# Patient Record
Sex: Female | Born: 1955 | Race: White | Hispanic: No | Marital: Married | Smoking: Current every day smoker
Health system: Southern US, Community
[De-identification: ages and names within clinical notes are randomized; demographics above are authoritative.]

## PROBLEM LIST (undated history)

## (undated) DIAGNOSIS — I10 Essential (primary) hypertension: Secondary | ICD-10-CM

## (undated) DIAGNOSIS — M0520 Rheumatoid vasculitis with rheumatoid arthritis of unspecified site: Secondary | ICD-10-CM

## (undated) DIAGNOSIS — IMO0002 Reserved for concepts with insufficient information to code with codable children: Secondary | ICD-10-CM

## (undated) DIAGNOSIS — R002 Palpitations: Secondary | ICD-10-CM

## (undated) DIAGNOSIS — G43109 Migraine with aura, not intractable, without status migrainosus: Secondary | ICD-10-CM

## (undated) DIAGNOSIS — M329 Systemic lupus erythematosus, unspecified: Secondary | ICD-10-CM

## (undated) DIAGNOSIS — E785 Hyperlipidemia, unspecified: Secondary | ICD-10-CM

## (undated) DIAGNOSIS — R55 Syncope and collapse: Secondary | ICD-10-CM

## (undated) HISTORY — DX: Syncope and collapse: R55

## (undated) HISTORY — PX: SPINE SURGERY: SHX786

## (undated) HISTORY — DX: Migraine with aura, not intractable, without status migrainosus: G43.109

## (undated) HISTORY — DX: Essential (primary) hypertension: I10

## (undated) HISTORY — DX: Hyperlipidemia, unspecified: E78.5

## (undated) HISTORY — DX: Palpitations: R00.2

## (undated) HISTORY — PX: LOOP RECORDER IMPLANT: SHX5954

## (undated) HISTORY — DX: Rheumatoid vasculitis with rheumatoid arthritis of unspecified site: M05.20

---

## 2013-08-08 DIAGNOSIS — F17200 Nicotine dependence, unspecified, uncomplicated: Secondary | ICD-10-CM | POA: Insufficient documentation

## 2013-08-08 DIAGNOSIS — G43109 Migraine with aura, not intractable, without status migrainosus: Secondary | ICD-10-CM | POA: Insufficient documentation

## 2013-08-08 DIAGNOSIS — I10 Essential (primary) hypertension: Secondary | ICD-10-CM | POA: Insufficient documentation

## 2013-08-08 DIAGNOSIS — E785 Hyperlipidemia, unspecified: Secondary | ICD-10-CM | POA: Insufficient documentation

## 2014-06-08 DIAGNOSIS — R002 Palpitations: Secondary | ICD-10-CM | POA: Insufficient documentation

## 2014-06-08 DIAGNOSIS — R55 Syncope and collapse: Secondary | ICD-10-CM | POA: Insufficient documentation

## 2014-06-08 DIAGNOSIS — Z8249 Family history of ischemic heart disease and other diseases of the circulatory system: Secondary | ICD-10-CM | POA: Insufficient documentation

## 2014-12-04 ENCOUNTER — Ambulatory Visit (INDEPENDENT_AMBULATORY_CARE_PROVIDER_SITE_OTHER): Payer: BLUE CROSS/BLUE SHIELD | Admitting: Internal Medicine

## 2014-12-04 ENCOUNTER — Encounter: Payer: Self-pay | Admitting: Internal Medicine

## 2014-12-04 VITALS — BP 180/88 | HR 52 | Ht 64.0 in | Wt 129.4 lb

## 2014-12-04 DIAGNOSIS — I10 Essential (primary) hypertension: Secondary | ICD-10-CM | POA: Diagnosis not present

## 2014-12-04 DIAGNOSIS — R011 Cardiac murmur, unspecified: Secondary | ICD-10-CM

## 2014-12-04 MED ORDER — AMLODIPINE BESYLATE 10 MG PO TABS: 10.0000 mg | ORAL_TABLET | Freq: Every day | ORAL | Status: DC

## 2014-12-04 NOTE — Progress Notes (Signed)
Cardiology Office Note   Date:  12/04/2014   ID:  Maria StanfordJanet Webster, DOB 09/30/1955, MRN [id number]  PCP:  Hassie BruceFUENTES,EDWIN, DO  Cardiologist:   Dietrich PatesPaula Lucciana Head, MD   No chief complaint on file.     History of Present Illness: Maria Webster is a 59 y.o. female with a history of palpitations, dizziness/presyncope   [facility] her report had extensive work up  Carotid USN showed mild narrowings   Underwent LINQ implant  Was seen in f/u in March  Amlodipine wasw increased at that visit.   LINQ interrogation to date showed no arrhythmia.  Had tansient episode syncope yesterday  Did not activitate LINQ or call Working long hours  Is tired    Talking to the patinet she is not taking amlodipine  Says her primary MD told her it was too much for her  Taking other meds  Denies CP  No palpitaitons.  Breathing OK    Current Outpatient Prescriptions  Medication Sig Dispense Refill  . amLODipine (NORVASC) 10 MG tablet Take 10 mg by mouth daily.    Marland Kitchen. aspirin 81 MG tablet Take 81 mg by mouth daily.    Marland Kitchen. atorvastatin (LIPITOR) 40 MG tablet Take 40 mg by mouth daily.    . Cholecalciferol (VITAMIN D3) 2000 UNITS capsule Take 2,000 Units by mouth daily.    . hydroxychloroquine (PLAQUENIL) 200 MG tablet Take by mouth daily.    Marland Kitchen. ibuprofen (ADVIL,MOTRIN) 800 MG tablet Take 800 mg by mouth every 8 (eight) hours as needed.    . metoprolol (LOPRESSOR) 100 MG tablet Take 100 mg by mouth 2 (two) times daily.    . vitamin C (ASCORBIC ACID) 500 MG tablet Take 500 mg by mouth daily.     No current facility-administered medications for this visit.    Allergies:   Codeine; Latex; and Novocain   Past Medical History  Diagnosis Date  . Syncope   . Palpitations   . Migraine with aura   . Hypertension   . Dyslipidemia     Past Surgical History  Procedure Laterality Date  . Loop recorder implant      Performed by Lynnell ChadPamela Mason, [facility] at [facility] LAB[facility]  . Spine surgery    .  Cesarean section      x2     Social History:  The patient  reports that she has been smoking.  She does not have any smokeless tobacco history on file. She reports that she does not drink alcohol or use illicit drugs.   Family History:  The patient's family history includes Heart attack in her sister and sister; Heart disease in her brother, brother, mother, and sister; Hypertension in her mother; Leukemia in her brother; Migraines in her mother and sister.    ROS:  Please see the history of present illness. All other systems are reviewed and  Negative to the above problem except as noted.    PHYSICAL EXAM: VS:  BP 180/88 mmHg  Pulse 52  Ht 5\' 4"  (1.626 m)  Wt 129 lb 6.4 oz (58.695 kg)  BMI 22.20 kg/m2  GEN: Well nourished, well developed, in no acute distress HEENT: normal Neck: no JVD, carotid bruits, or masses Cardiac: RRR; no murmurs, rubs, or gallops,no edema  Respiratory:  clear to auscultation bilaterally, normal work of breathing GI: soft, nontender, nondistended, + BS  No hepatomegaly  MS: no deformity Moving all extremities   Skin: warm and dry, no  rash Neuro:  Strength and sensation are intact Psych: euthymic mood, full affect   EKG:  EKG is ordered today.  Sinus bradycardia  52 bpm    Lipid Panel No results found for: CHOL, TRIG, HDL, CHOLHDL, VLDL, LDLCALC, LDLDIRECT    Wt Readings from Last 3 Encounters:  12/04/14 129 lb 6.4 oz (58.695 kg)      ASSESSMENT AND PLAN:  1.  HTN  BP is elevated today.  I would recomm that she take amlodipine  I would recomm that she continue to follow BP I would check an echo and renal usn. F/U in 4 wks    2.  Syncope  Had short spell driving  Seconds  I told her she cannot drive until cause is found or 6 mon without a syncopal spell  F/U in 1 month with me  Signed, Dietrich PatesPaula Amdrew Oboyle, MD  12/04/2014 12:33 PM    [facility] [address], [place], [place]  [address] Phone: [phone]; Fax: [phone])  [phone]

## 2014-12-04 NOTE — Patient Instructions (Signed)
Your physician has recommended you make the following change in your medication:  Restart your amlodipine 10 mg daily. Continue all other medications the same. Your physician has requested that you have an echocardiogram. Echocardiography is a painless test that uses sound waves to create images of your heart. It provides your doctor with information about the size and shape of your heart and how well your heart's chambers and valves are working. This procedure takes approximately one hour. There are no restrictions for this procedure. Your physician has requested that you have a renal artery duplex. During this test, an ultrasound is used to evaluate blood flow to the kidneys. Allow one hour for this exam. Do not eat after midnight the day before and avoid carbonated beverages. Take your medications as you usually do. Your physician recommends that you schedule a follow-up appointment in: [facility] Dr. Tenny Crawoss.

## 2015-01-01 ENCOUNTER — Ambulatory Visit (INDEPENDENT_AMBULATORY_CARE_PROVIDER_SITE_OTHER): Payer: BLUE CROSS/BLUE SHIELD | Admitting: Internal Medicine

## 2015-01-01 ENCOUNTER — Encounter: Payer: Self-pay | Admitting: Internal Medicine

## 2015-01-01 ENCOUNTER — Ambulatory Visit (HOSPITAL_BASED_OUTPATIENT_CLINIC_OR_DEPARTMENT_OTHER): Payer: BLUE CROSS/BLUE SHIELD

## 2015-01-01 ENCOUNTER — Other Ambulatory Visit: Payer: Self-pay

## 2015-01-01 ENCOUNTER — Ambulatory Visit (HOSPITAL_COMMUNITY): Payer: BLUE CROSS/BLUE SHIELD | Attending: Cardiovascular Disease

## 2015-01-01 VITALS — BP 126/78 | HR 49 | Ht 64.0 in | Wt 128.6 lb

## 2015-01-01 DIAGNOSIS — I340 Nonrheumatic mitral (valve) insufficiency: Secondary | ICD-10-CM | POA: Diagnosis not present

## 2015-01-01 DIAGNOSIS — I351 Nonrheumatic aortic (valve) insufficiency: Secondary | ICD-10-CM | POA: Diagnosis not present

## 2015-01-01 DIAGNOSIS — I350 Nonrheumatic aortic (valve) stenosis: Secondary | ICD-10-CM | POA: Diagnosis not present

## 2015-01-01 DIAGNOSIS — I10 Essential (primary) hypertension: Secondary | ICD-10-CM

## 2015-01-01 DIAGNOSIS — I700 Atherosclerosis of aorta: Secondary | ICD-10-CM | POA: Insufficient documentation

## 2015-01-01 DIAGNOSIS — R011 Cardiac murmur, unspecified: Secondary | ICD-10-CM | POA: Diagnosis not present

## 2015-01-01 DIAGNOSIS — G4733 Obstructive sleep apnea (adult) (pediatric): Secondary | ICD-10-CM | POA: Diagnosis not present

## 2015-01-01 DIAGNOSIS — I7090 Unspecified atherosclerosis: Secondary | ICD-10-CM | POA: Diagnosis not present

## 2015-01-01 NOTE — Progress Notes (Signed)
Cardiology Office Note   Date:  01/01/2015   ID:  Maria Webster, DOB 04-Aug-1955, MRN [id number]  PCP:  Hassie Bruce DO  Cardiologist:   Dietrich Pates, MD   No chief complaint on file.     History of Present Illness: Eizabeth Webster is a 59 y.o. female with a history of  I saw the pt in May 2016  Hx of palpitations, dizziness/ presyncope   Had linq put in at [facility].  BP was elevated  recomm amlodipne   Primary MD had taken off   With 4 wk f/u   Had short spell syncope while driving.       Current Outpatient Prescriptions  Medication Sig Dispense Refill  . amLODipine (NORVASC) 10 MG tablet Take 1 tablet (10 mg total) by mouth daily. 30 tablet 3  . aspirin 81 MG tablet Take 81 mg by mouth daily.    Marland Kitchen atorvastatin (LIPITOR) 40 MG tablet Take 40 mg by mouth daily.    . Cholecalciferol (VITAMIN D3) 2000 UNITS capsule Take 2,000 Units by mouth daily.    . hydroxychloroquine (PLAQUENIL) 200 MG tablet Take by mouth daily.    Marland Kitchen ibuprofen (ADVIL,MOTRIN) 800 MG tablet Take 800 mg by mouth every 8 (eight) hours as needed.    . metoprolol (LOPRESSOR) 100 MG tablet Take 100 mg by mouth 2 (two) times daily.    . vitamin C (ASCORBIC ACID) 500 MG tablet Take 500 mg by mouth daily.     No current facility-administered medications for this visit.    Allergies:   Codeine; Latex; and Novocain   Past Medical History  Diagnosis Date  . Syncope   . Palpitations   . Migraine with aura   . Hypertension   . Dyslipidemia     Past Surgical History  Procedure Laterality Date  . Loop recorder implant      Performed by Lynnell Chad, MD at [facility] CARDIAC CATH AND EP LABS  . Spine surgery    . Cesarean section      x2     Social History:  The patient  reports that she has been smoking Cigarettes.  She has been smoking about 0.50 packs per day. She does not have any smokeless tobacco history on file. She reports that she does not drink alcohol or use illicit drugs.   Family History:  The patient's  family history includes Heart attack in her sister and sister; Heart disease in her brother, brother, mother, and sister; Hypertension in her mother; Leukemia in her brother; Migraines in her mother and sister.    ROS:  Please see the history of present illness. All other systems are reviewed and  Negative to the above problem except as noted.    PHYSICAL EXAM: VS:  Pulse 49  Ht 5\' 4"  (1.626 m)  Wt 128 lb 9.6 oz (58.333 kg)  BMI 22.06 kg/m2  SpO2 92%  GEN: Well nourished, well developed, in no acute distress HEENT: normal Neck: no JVD, carotid bruits, or masses Cardiac: RRR; no murmurs, rubs, or gallops,no edema  Respiratory:  clear to auscultation bilaterally, normal work of breathing GI: soft, nontender, nondistended, + BS  No hepatomegaly  MS: no deformity Moving all extremities   Skin: warm and dry, no rash Neuro:  Strength and sensation are intact Psych: euthymic mood, full affect   EKG:  EKG is not ordered today.   Lipid Panel No results found for: CHOL, TRIG, HDL, CHOLHDL, VLDL, LDLCALC, LDLDIRECT    Wt Readings  from Last 3 Encounters:  01/01/15 128 lb 9.6 oz (58.333 kg)  12/04/14 129 lb 6.4 oz (58.695 kg)      ASSESSMENT AND PLAN:  1  Dizziness  / syncope Pt has LINQ placed  Need to get in cont[facility]ct with [facility] No change in regimen  No driving  2.  HTN  Continue meds      Signed, Dietrich Pates, MD  01/01/2015 2:16 PM    [facility] [address] [place], [place], [place]  [address] Phone: [phone]; Fax: [phone]

## 2015-01-01 NOTE — Patient Instructions (Signed)
Your physician recommends that you schedule a follow-up appointment in: to be determined  Your physician recommends that you continue on your current medications as directed. Please refer to the Current Medication list given to you today.  Dr. Tenny Craw will call you after speaking with neurologist/other physicians   Thanks for choosing [facility]!!!

## 2015-02-24 ENCOUNTER — Emergency Department (HOSPITAL_COMMUNITY)
Admission: EM | Admit: 2015-02-24 | Discharge: 2015-02-24 | Disposition: A | Discharge status: Home / Self Care | Payer: BLUE CROSS/BLUE SHIELD | Attending: Emergency Medicine | Admitting: Emergency Medicine

## 2015-02-24 ENCOUNTER — Telehealth: Payer: Self-pay | Admitting: Internal Medicine

## 2015-02-24 ENCOUNTER — Encounter (HOSPITAL_COMMUNITY): Payer: Self-pay

## 2015-02-24 DIAGNOSIS — G43109 Migraine with aura, not intractable, without status migrainosus: Secondary | ICD-10-CM | POA: Insufficient documentation

## 2015-02-24 DIAGNOSIS — Z79899 Other long term (current) drug therapy: Secondary | ICD-10-CM | POA: Insufficient documentation

## 2015-02-24 DIAGNOSIS — Z8739 Personal history of other diseases of the musculoskeletal system and connective tissue: Secondary | ICD-10-CM | POA: Insufficient documentation

## 2015-02-24 DIAGNOSIS — E785 Hyperlipidemia, unspecified: Secondary | ICD-10-CM | POA: Insufficient documentation

## 2015-02-24 DIAGNOSIS — Z7982 Long term (current) use of aspirin: Secondary | ICD-10-CM | POA: Insufficient documentation

## 2015-02-24 DIAGNOSIS — R55 Syncope and collapse: Secondary | ICD-10-CM | POA: Diagnosis not present

## 2015-02-24 DIAGNOSIS — I10 Essential (primary) hypertension: Secondary | ICD-10-CM | POA: Diagnosis not present

## 2015-02-24 DIAGNOSIS — Z720 Tobacco use: Secondary | ICD-10-CM | POA: Diagnosis not present

## 2015-02-24 HISTORY — DX: Reserved for concepts with insufficient information to code with codable children: IMO0002

## 2015-02-24 HISTORY — DX: Systemic lupus erythematosus, unspecified: M32.9

## 2015-02-24 LAB — BASIC METABOLIC PANEL
Anion gap: 9 (ref 5–15)
BUN: 12 mg/dL (ref 6–20)
CHLORIDE: 106 mmol/L (ref 101–111)
CO2: 26 mmol/L (ref 22–32)
CREATININE: 0.84 mg/dL (ref 0.44–1.00)
Calcium: 9.7 mg/dL (ref 8.9–10.3)
GFR calc Af Amer: >60 mL/min (ref >60)
GFR calc non Af Amer: >60 mL/min (ref >60)
GLUCOSE: 98 mg/dL (ref 65–99)
Potassium: 3.6 mmol/L (ref 3.5–5.1)
Sodium: 141 mmol/L (ref 135–145)

## 2015-02-24 LAB — CBC WITH DIFFERENTIAL/PLATELET
BASOS ABS: 0 10*3/uL (ref 0.0–0.1)
Basophils Relative: 1 % (ref 0–1)
Eosinophils Absolute: 0.2 10*3/uL (ref 0.0–0.7)
Eosinophils Relative: 3 % (ref 0–5)
HCT: 39.3 % (ref 36.0–46.0)
Hemoglobin: 13.6 g/dL (ref 12.0–15.0)
LYMPHS ABS: 2.2 10*3/uL (ref 0.7–4.0)
LYMPHS PCT: 29 % (ref 12–46)
MCH: 32.7 pg (ref 26.0–34.0)
MCHC: 34.6 g/dL (ref 30.0–36.0)
MCV: 94.5 fL (ref 78.0–100.0)
Monocytes Absolute: 0.4 10*3/uL (ref 0.1–1.0)
Monocytes Relative: 6 % (ref 3–12)
NEUTROS ABS: 4.7 10*3/uL (ref 1.7–7.7)
NEUTROS PCT: 61 % (ref 43–77)
PLATELETS: 161 10*3/uL (ref 150–400)
RBC: 4.16 MIL/uL (ref 3.87–5.11)
RDW: 13.1 % (ref 11.5–15.5)
WBC: 7.6 10*3/uL (ref 4.0–10.5)

## 2015-02-24 LAB — TROPONIN I

## 2015-02-24 NOTE — Telephone Encounter (Signed)
Pt c/o Syncope: STAT if syncope occurred within 30 minutes and pt complains of lightheadedness High Priority if episode of passing out, completely, today or in last 24 hours   1. Did you pass out today? YES   2. When is the last time you passed out? Today at 3pm   3. Has this occurred multiple times? Previously yes  4. Did you have any symptoms prior to passing out? Other than her heart beating fast. She Just felt foolish and like "Crap"   BP was 180/? She know it was high because she could feel her heart beating fairly fast. Pt reports that some how her BP is not coming down.

## 2015-02-24 NOTE — ED Provider Notes (Signed)
CSN: [id number]     Arrival date & time 02/24/15  1735 History   First MD Initiated Contact with Patient 02/24/15 1809     Chief Complaint  Patient presents with  . Loss of Consciousness      HPI Pt reports syncope with preceding "racing and pounding" palpitations. Pt with long standing hx of syncope and presyncope without a diagnosis to this point. No CP prior to syncope and asymptomatic at this time. Reports no n/v/d. 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 Attempts to keep herself hydrated at work. No fever or chills. No other complaints.    Past Medical History  Diagnosis Date  . Syncope   . Palpitations   . Migraine with aura   . Hypertension   . Dyslipidemia   . Lupus    Past Surgical History  Procedure Laterality Date  . Loop recorder implant      Performed by Lynnell Chad, MD at [facility] CARDIAC CATH AND EP LABS  . Spine surgery    . Cesarean section      x2   Family History  Problem Relation Age of Onset  . Hypertension Mother   . Heart disease Mother   . Migraines Mother   . Heart disease Sister   . Migraines Sister   . Heart attack Sister   . Heart disease Brother   . Leukemia Brother   . Heart attack Sister   . Heart disease Brother    Social History  Substance Use Topics  . Smoking status: Current Every Day Smoker -- 0.50 packs/day    Types: Cigarettes  . Smokeless tobacco: None  . Alcohol Use: No   OB History    No data available     Review of Systems  All other systems reviewed and are negative.     Allergies  Codeine; Latex; and Novocain  Home Medications   Prior to Admission medications   Medication Sig Start Date End Date Taking? Authorizing Provider  amLODipine (NORVASC) 10 MG tablet Take 1 tablet (10 mg total) by mouth daily. 12/04/14  Yes Pricilla Riffle, MD  aspirin EC 81 MG tablet Take 81 mg by mouth daily.   Yes Historical Provider, MD  atorvastatin (LIPITOR) 40 MG tablet Take 40 mg by mouth every evening.    Yes Historical Provider, MD   Cholecalciferol (VITAMIN D3) 2000 UNITS capsule Take 2,000 Units by mouth daily.   Yes Historical Provider, MD  hydroxychloroquine (PLAQUENIL) 200 MG tablet Take 200 mg by mouth daily.    Yes Historical Provider, MD  metoprolol (LOPRESSOR) 100 MG tablet Take 100 mg by mouth 2 (two) times daily.   Yes Historical Provider, MD  vitamin C (ASCORBIC ACID) 500 MG tablet Take 500 mg by mouth daily.   Yes Historical Provider, MD   BP 164/85 mmHg  Pulse 75  Temp(Src) 98.6 F (37 C) (Oral)  Resp 16  Ht  (1.626 m)  Wt 129 lb (58.514 kg)  BMI 22.13 kg/m2  SpO2 99% Physical Exam  Constitutional: She is oriented to person, place, and time. She appears well-developed and well-nourished. No distress.  HENT:  Head: Normocephalic and atraumatic.  Eyes: EOM are normal.  Neck: Normal range of motion.  Cardiovascular: Normal rate, regular rhythm and normal heart sounds.   Pulmonary/Chest: Effort normal and breath sounds normal.  Abdominal: Soft. She exhibits no distension. There is no tenderness.  Musculoskeletal: Normal range of motion.  Neurological: She is alert and oriented to person, place, and time.  Skin: Skin is warm and dry.  Psychiatric: She has a normal mood and affect. Judgment normal.  Nursing note and vitals reviewed.   ED Course  Procedures (including critical care time) Labs Review Labs Reviewed  CBC WITH DIFFERENTIAL/PLATELET  BASIC METABOLIC PANEL  TROPONIN I     Imaging Review No results found.  ECG interpretation   Date: 02/24/2015  Rate: 73  Rhythm: normal sinus rhythm  QRS Axis: normal  Intervals: normal  ST/T Wave abnormalities: normal  Conduction Disutrbances: none  Narrative Interpretation:   Old EKG Reviewed: No significant changes noted     I reviewed available ER/hospitalization records through the EMR    MDM   Final diagnoses:  None    medtronic implantable loop recorder interrogated. Possible PAC or PVC noted. No tachyarrythmias  noted. Labs normal. Long standing hx of the same. pcp follow up    Azalia Bilis, MD 02/24/15 [phone]

## 2015-02-24 NOTE — ED Notes (Signed)
Pt reports has a recordable loop heart monitor and reports passed out at work today.   Pt says she called her cardiologist approx 1 1/2 hours after she passed out.  Pt c/o feeling tired after the incident.  Pt says felt "fine" early this morning.  Pt says for years has been having syncopal episodes.  Pt says prior to passing out today, felt like heart was "pounding."  Pt denies any pain or SOB.

## 2015-02-24 NOTE — Telephone Encounter (Signed)
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  Pt stated she was down for about 2 minutes.  Pt stated that office first responders were there provided here with cold wet cloth on back of her neck and sat her in a chair. Pt stated that she felt funny and now "feels like someone he has been beat up."  On phone pt checked on BP 175/94 HR 79. Pt stated this has a LINQ.  Tried to pull pt LINQ report to see if we could see any transmission but our office does not have access.  Called pt back told to go to the ED via EMS as she needs to have blood work drawn and be evaluated. Pt stated she would not drive but will get her husband to drive, that is fine as long as she is not driving. Pt stated she is going to [employer]

## 2015-02-24 NOTE — Discharge Instructions (Signed)
Syncope °Syncope is a medical term for fainting or passing out. This means you lose consciousness and drop to the ground. People are generally unconscious for less than 5 minutes. You may have some muscle twitches for up to 15 seconds before waking up and returning to normal. Syncope occurs more often in older adults, but it can happen to anyone. While most causes of syncope are not dangerous, syncope can be a sign of a serious medical problem. It is important to seek medical care.  °CAUSES  °Syncope is caused by a sudden drop in blood flow to the brain. The specific cause is often not determined. Factors that can bring on syncope include: °· Taking medicines that lower blood pressure. °· Sudden changes in posture, such as standing up quickly. °· Taking more medicine than prescribed. °· Standing in one place for too long. °· Seizure disorders. °· Dehydration and excessive exposure to heat. °· Low blood sugar (hypoglycemia). °· Straining to have a bowel movement. °· Heart disease, irregular heartbeat, or other circulatory problems. °· Fear, emotional distress, seeing blood, or severe pain. °SYMPTOMS  °Right before fainting, you may: °· Feel dizzy or light-headed. °· Feel nauseous. °· See all white or all black in your field of vision. °· Have cold, clammy skin. °DIAGNOSIS  °Your health care provider will ask about your symptoms, perform a physical exam, and perform an electrocardiogram (ECG) to record the electrical activity of your heart. Your health care provider may also perform other heart or blood tests to determine the cause of your syncope which may include: °· Transthoracic echocardiogram (TTE). During echocardiography, sound waves are used to evaluate how blood flows through your heart. °· Transesophageal echocardiogram (TEE). °· Cardiac monitoring. This allows your health care provider to monitor your heart rate and rhythm in real time. °· Holter monitor. This is a portable device that records your  heartbeat and can help diagnose heart arrhythmias. It allows your health care provider to track your heart activity for several days, if needed. °· Stress tests by exercise or by giving medicine that makes the heart beat faster. °TREATMENT  °In most cases, no treatment is needed. Depending on the cause of your syncope, your health care provider may recommend changing or stopping some of your medicines. °HOME CARE INSTRUCTIONS °· Have someone stay with you until you feel stable. °· Do not drive, use machinery, or play sports until your health care provider says it is okay. °· Keep all follow-up appointments as directed by your health care provider. °· Lie down right away if you start feeling like you might faint. Breathe deeply and steadily. Wait until all the symptoms have passed. °· Drink enough fluids to keep your urine clear or pale yellow. °· If you are taking blood pressure or heart medicine, get up slowly and take several minutes to sit and then stand. This can reduce dizziness. °SEEK IMMEDIATE MEDICAL CARE IF:  °· You have a severe headache. °· You have unusual pain in the chest, abdomen, or back. °· You are bleeding from your mouth or rectum, or you have black or tarry stool. °· You have an irregular or very fast heartbeat. °· You have pain with breathing. °· You have repeated fainting or seizure-like jerking during an episode. °· You faint when sitting or lying down. °· You have confusion. °· You have trouble walking. °· You have severe weakness. °· You have vision problems. °If you fainted, call your local emergency services (911 in [place] Do not drive   yourself to the hospital.  °MAKE SURE YOU: °· Understand these instructions. °· Will watch your condition. °· Will get help right away if you are not doing well or get worse. °Document Released: 07/03/2005 Document Revised: 07/08/2013 Document Reviewed: 09/01/2011 °ExitCare® Patient Information ©2015 [employer]. This information is not intended to replace  advice given to you by your health care provider. Make sure you discuss any questions you have with your health care provider. ° °

## 2015-02-25 ENCOUNTER — Other Ambulatory Visit: Payer: Self-pay | Admitting: Internal Medicine

## 2015-02-25 MED ORDER — METOPROLOL TARTRATE 100 MG PO TABS: 100.0000 mg | ORAL_TABLET | Freq: Two times a day (BID) | ORAL | Status: DC

## 2015-02-25 NOTE — Telephone Encounter (Signed)
Pt is currently out of her Metoprolol  and she uses [facility]

## 2015-03-23 ENCOUNTER — Encounter: Payer: Self-pay | Admitting: Internal Medicine

## 2015-04-19 ENCOUNTER — Ambulatory Visit: Payer: BLUE CROSS/BLUE SHIELD | Admitting: Internal Medicine

## 2015-05-17 ENCOUNTER — Ambulatory Visit: Payer: BLUE CROSS/BLUE SHIELD | Admitting: Internal Medicine

## 2015-06-07 ENCOUNTER — Ambulatory Visit (INDEPENDENT_AMBULATORY_CARE_PROVIDER_SITE_OTHER): Payer: BLUE CROSS/BLUE SHIELD | Admitting: Internal Medicine

## 2015-06-07 ENCOUNTER — Encounter: Payer: Self-pay | Admitting: Internal Medicine

## 2015-06-07 VITALS — BP 158/70 | HR 56 | Ht 64.0 in | Wt 127.0 lb

## 2015-06-07 DIAGNOSIS — R55 Syncope and collapse: Secondary | ICD-10-CM

## 2015-06-07 MED ORDER — METOPROLOL TARTRATE 50 MG PO TABS: 50.0000 mg | ORAL_TABLET | Freq: Two times a day (BID) | ORAL | Status: DC

## 2015-06-07 MED ORDER — TRIAMTERENE-HCTZ 37.5-25 MG PO TABS: 0.5000 | ORAL_TABLET | Freq: Every day | ORAL | Status: DC

## 2015-06-07 MED ORDER — AMLODIPINE BESYLATE 10 MG PO TABS: 10.0000 mg | ORAL_TABLET | Freq: Every day | ORAL | Status: DC

## 2015-06-07 NOTE — Progress Notes (Signed)
Cardiology Office Note   Date:  06/07/2015   ID:  Maria Webster, DOB [birth year], MRN [id number]  PCP:  Hassie Bruce DO  Cardiologist:   Dietrich Pates, MD    F/U of syncope     History of Present Illness: Maria Webster is a 59 y.o. female with a history of palpitations , dizziness/presyncope and syncope  She has been seen at [facility]  I saw her in June  Records from [facility] sent  No arrhythmia seen after episode of syncope earlier this year    In August she had episode of syncope at work  Prior to passing out felt heart pounding  Loop interrogated  Possible PAC ofr PVC    Patient said in August at work  At 2 PM got light hea  Feels light headed a lot  Has to hold on to things  Feels like drunk  Room doesn't spin [place] manuevers neg  Will go numb in face  She is tired of feeling bad   No CP       Current Outpatient Prescriptions  Medication Sig Dispense Refill  . amLODipine (NORVASC) 10 MG tablet Take 1 tablet (10 mg total) by mouth daily. 30 tablet 3  . aspirin EC 81 MG tablet Take 81 mg by mouth daily.    Marland Kitchen atorvastatin (LIPITOR) 40 MG tablet Take 40 mg by mouth every evening.     . Cholecalciferol (VITAMIN D3) 2000 UNITS capsule Take 2,000 Units by mouth daily.    . hydroxychloroquine (PLAQUENIL) 200 MG tablet Take 200 mg by mouth daily.     . metoprolol (LOPRESSOR) 100 MG tablet Take 1 tablet (100 mg total) by mouth 2 (two) times daily. 180 tablet 1  . vitamin C (ASCORBIC ACID) 500 MG tablet Take 500 mg by mouth daily.     No current facility-administered medications for this visit.    Allergies:   Codeine; Latex; and Novocain   Past Medical History  Diagnosis Date  . Syncope   . Palpitations   . Migraine with aura   . Hypertension   . Dyslipidemia   . Lupus [facility])     Past Surgical History  Procedure Laterality Date  . Loop recorder implant      Performed by Lynnell Chad, MD at [facility] CARDIAC CATH AND EP LABS  . Spine surgery    . Cesarean section      x2      Social History:  The patient  reports that she has been smoking Cigarettes.  She has been smoking about 0.50 packs per day. She does not have any smokeless tobacco history on file. She reports that she does not drink alcohol or use illicit drugs.   Family History:  The patient's family history includes Heart attack in her sister and sister; Heart disease in her brother, brother, mother, and sister; Hypertension in her mother; Leukemia in her brother; Migraines in her mother and sister.    ROS:  Please see the history of present illness. All other systems are reviewed and  Negative to the above problem except as noted.    PHYSICAL EXAM: VS:  BP 158/70 mmHg  Pulse 56  Ht  (1.626 m)  Wt 57.607 kg (127 lb)  BMI 21.79 kg/m2  SpO2 95%  GEN: Well nourished, well developed, in no acute distress HEENT: normal Neck: no JVD, carotid bruits, or masses Cardiac: RRR; no murmurs, rubs, or gallops,no edema  Respiratory:  clear to auscultation bilaterally, normal work of breathing  GI: soft, nontender, nondistended, + BS  No hepatomegaly  MS: no deformity Moving all extremities   Skin: warm and dry, no rash Neuro:  Strength and sensation are intact Psych: euthymic mood, full affect   EKG:  EKG is NOT ordered today.   Lipid Panel No results found for: CHOL, TRIG, HDL, CHOLHDL, VLDL, LDLCALC, LDLDIRECT    Wt Readings from Last 3 Encounters:  06/07/15 57.607 kg (127 lb)  02/24/15 58.514 kg (129 lb)  01/01/15 58.333 kg (128 lb 9.6 oz)      ASSESSMENT AND PLAN:  1  Syncope  Last spell in August  [facility] to [facility] and weak, dizzy at times Encouraged increased fluids   Not orthostatic  Acutally hypertensive with standing  HR is low I would pull back on metoprolol to 50 bid and add low dose maxzide Discuss with Medtr[employer]  Pt says that she is getting calls frequently asking how she is feeling  Says they tell her she has skips Discuss with  EP  2.  HTN  As above  Follow BP     Signed, Dietrich PatesPaula Belva Koziel, MD  06/07/2015 9:25 AM    [facility] [address], [place], [place]  [address] Phone: [phone]([phone]; Fax: [phone]

## 2015-06-07 NOTE — Patient Instructions (Signed)
Your physician recommends that you schedule a follow-up appointment in: To Be Determined   Your physician has recommended you make the following change in your medication:   Decrease Metoprolol ( Lopressor) to 50 mg Two Times Daily  Start Maxzide 37.5-25 mg Take 1/2 Tablet Daily   If you need a refill on your cardiac medications before your next appointment, please call your pharmacy.  Thank you for choosing [facility]!

## 2015-07-23 ENCOUNTER — Encounter: Payer: Self-pay | Admitting: Internal Medicine

## 2015-07-23 ENCOUNTER — Ambulatory Visit (INDEPENDENT_AMBULATORY_CARE_PROVIDER_SITE_OTHER): Payer: BLUE CROSS/BLUE SHIELD | Admitting: Internal Medicine

## 2015-07-23 VITALS — BP 152/72 | HR 41 | Ht 64.0 in | Wt 128.0 lb

## 2015-07-23 DIAGNOSIS — R55 Syncope and collapse: Secondary | ICD-10-CM | POA: Diagnosis not present

## 2015-07-23 DIAGNOSIS — I10 Essential (primary) hypertension: Secondary | ICD-10-CM

## 2015-07-23 MED ORDER — METOPROLOL TARTRATE 25 MG PO TABS: 25.0000 mg | ORAL_TABLET | Freq: Two times a day (BID) | ORAL | Status: DC

## 2015-07-23 MED ORDER — LOSARTAN POTASSIUM 50 MG PO TABS: 50.0000 mg | ORAL_TABLET | Freq: Every day | ORAL | Status: DC

## 2015-07-23 NOTE — Progress Notes (Signed)
Cardiology Office Note   Date:  07/23/2015   ID:  Maria StanfordJanet Aung, DOB 04/15/1956, MRN [id number]  PCP:  Hassie BruceFUENTES,EDWIN, DO  Cardiologist:   Dietrich PatesPaula Ross, MD   F/U of dizziness/syncope    History of Present Illness: Maria Webster is a 60 y.o. female with a history of palpitations , dizziness and syncope  I saw the pt earlier this fall.  [facility] LINQ in place.  Last interrogation no signif arrhythmia  Since seen she has had no syncopal spells  Has been dizziy, drunk like at times    On last visit I recomm that she decrease metoprolol to 50 bid  Add low dose maxzide  HR was low   She took 1 dose of Maxzide and says her HR went to 160  Stopped  Has not taken any more   She still feels fatigueded        Current Outpatient Prescriptions  Medication Sig Dispense Refill  . amLODipine (NORVASC) 10 MG tablet Take 1 tablet (10 mg total) by mouth daily. 90 tablet 3  . aspirin EC 81 MG tablet Take 81 mg by mouth daily.    Marland Kitchen. atorvastatin (LIPITOR) 40 MG tablet Take 40 mg by mouth every evening.     . Cholecalciferol (VITAMIN D3) 2000 UNITS capsule Take 2,000 Units by mouth daily.    . hydroxychloroquine (PLAQUENIL) 200 MG tablet Take 200 mg by mouth daily.     . metoprolol (LOPRESSOR) 50 MG tablet Take 1 tablet (50 mg total) by mouth 2 (two) times daily. 180 tablet 3  . triamterene-hydrochlorothiazide (MAXZIDE-25) 37.5-25 MG tablet Take 0.5 tablets by mouth daily. 15 tablet 3  . vitamin C (ASCORBIC ACID) 500 MG tablet Take 500 mg by mouth daily.     No current facility-administered medications for this visit.    Allergies:   Codeine; Latex; and Novocain   Past Medical History  Diagnosis Date  . Syncope   . Palpitations   . Migraine with aura   . Hypertension   . Dyslipidemia   . Lupus [facility](HCC)     Past Surgical History  Procedure Laterality Date  . Loop recorder implant      Per[facility] by Lynnell [facility]Pamela Mason[facility]  . Spine surgery      . Cesarean section      x2     Social History:  The patient  reports that she has been smoking Cigarettes.  She started smoking about 53 years ago. She has been smoking about 0.50 packs per day. She has never used smokeless tobacco. She reports that she does not drink alcohol or use illicit drugs.   Family History:  The patient's family history includes Heart attack in her sister and sister; Heart disease in her brother, brother, mother, and sister; Hypertension in her mother; Leukemia in her brother; Migraines in her mother and sister.    ROS:  Please see the history of present illness. All other systems are reviewed and  Negative to the above problem except as noted.    PHYSICAL EXAM: VS:  BP 152/72 mmHg  Pulse 41  Ht 5\' 4"  (1.626 m)  Wt 58.06 kg (128 lb)  BMI 21.96 kg/m2  SpO2 99%  GEN: Well nourished, well developed, in no acute distress HEENT: normal Neck: no JVD, carotid bruits, or masses Cardiac: RRR; no murmurs, rubs, or gallops,no edema  Respiratory:  clear to auscultation bilaterally, normal work of breathing GI:  soft, nontender, nondistended, + BS  No hepatomegaly  MS: no deformity Moving all extremities   Skin: warm and dry, no rash Neuro:  Strength and sensation are intact Psych: euthymic mood, full affect   EKG:  EKG is not ordered today.   Lipid Panel No results found for: CHOL, TRIG, HDL, CHOLHDL, VLDL, LDLCALC, LDLDIRECT    Wt Readings from Last 3 Encounters:  07/23/15 58.06 kg (128 lb)  06/07/15 57.607 kg (127 lb)  02/24/15 58.514 kg (129 lb)      ASSESSMENT AND PLAN:  1.  Dizziness/syncope  Pt has not had any more syncopal spells since seen  She still feels fatigued  I would recomm cutting back on metoprolol to 25 bid    Will contact EP  See if she can have remote checks in [place] and not [place]  2.  HTN  Will add Cozaar 50 qd    F/U in clinic in a couple months  Check BMET  In 10 days  At that time check lipid panel as well.      Signed, Dietrich Pates, MD  07/23/2015 1:12 PM    [facility] [address] [place], [place], [place]  [address] Phone: [phone]; Fax: [phone]

## 2015-07-23 NOTE — Patient Instructions (Signed)
Your physician recommends that you schedule a follow-up appointment in: 2 Months with Dr. Tenny Crawoss  Your physician recommends that you return for lab work in: 10 days to 2 weeks (BMet, Lipid)   Your physician has recommended you make the following change in your medication:   Start Cozaar 50 mg Take 1 Tablet by mouth Daily  Decrease Metoprolol to 25 mg Two Times Daily   If you need a refill on your cardiac medications before your next appointment, please call your pharmacy.  Thank you for choosing [facility]!

## 2015-08-11 ENCOUNTER — Encounter: Payer: Self-pay | Admitting: Internal Medicine

## 2015-08-11 ENCOUNTER — Telehealth: Payer: Self-pay | Admitting: *Deleted

## 2015-08-11 NOTE — Telephone Encounter (Signed)
Spoke with patient regarding symptom episodes on LINQ transmission.  Patient reports that early this morning (around 2-3am) she woke up and felt dizzy.  She used her symptom activator because she felt that her heart rate and B/P were elevated.  She states she drove down the street to a doctor's office later this morning and the office called 911 as her B/P was 240/117.  She was taken to [facility] and was given a medication in the ED that brought her B/P down to "109/something".  Patient is unsure of which medication it was.  She was discharged from the ED later in the morning and was told to schedule a follow-up appointment with Dr. Tenny Craw.  Patient states she was not given any prescriptions at discharge.  She states she has been taking her losartan  and metoprolol  as prescribed, but was only taking amlodipine  instead of .  Advised patient to continue taking all medications as prescribed and to ensure she is taking amlodipine .  Patient verbalizes understanding of instructions and denies additional questions at this time.  Will route to Dr. Tenny Craw for additional recommendations.

## 2015-08-12 NOTE — Telephone Encounter (Signed)
Pt states she will call back next week BP recordings

## 2015-08-12 NOTE — Telephone Encounter (Signed)
[id number] 

## 2015-08-12 NOTE — Telephone Encounter (Signed)
Have her continue to take BPs at home and call in with readings early next week

## 2015-08-25 ENCOUNTER — Telehealth: Payer: Self-pay | Admitting: Internal Medicine

## 2015-08-25 DIAGNOSIS — I10 Essential (primary) hypertension: Secondary | ICD-10-CM

## 2015-08-25 NOTE — Telephone Encounter (Signed)
Patient states that she was told to call back to report BP readings.  Knute Neu

## 2015-08-25 NOTE — Telephone Encounter (Signed)
Patient states that she takes all her med at 0330 every morning and takes Metoprolol again at 1800.        08/12/15     08/14/15        08/15/15    08/16/15     08/17/15   Morning   134/84       164/102       165/94     152/97      178/97   Noon        134/87      170/94          154/87     142/85       150/86   Night         162/83      143/84                          132/88        107/66

## 2015-08-26 MED ORDER — LOSARTAN POTASSIUM 100 MG PO TABS: 100.0000 mg | ORAL_TABLET | Freq: Every day | ORAL | Status: DC

## 2015-08-26 NOTE — Telephone Encounter (Signed)
Increae cozaar to 100 mg per day   F/U with BP check in 1 month  Bring cuff to clinic

## 2015-08-26 NOTE — Telephone Encounter (Signed)
Pt notified escribed rx bp apt made

## 2015-08-30 ENCOUNTER — Ambulatory Visit (INDEPENDENT_AMBULATORY_CARE_PROVIDER_SITE_OTHER): Payer: BLUE CROSS/BLUE SHIELD | Admitting: *Deleted

## 2015-08-30 DIAGNOSIS — R002 Palpitations: Secondary | ICD-10-CM

## 2015-08-31 NOTE — Progress Notes (Signed)
Carelink Summary Report / Loop Recorder 

## 2015-09-23 ENCOUNTER — Ambulatory Visit (INDEPENDENT_AMBULATORY_CARE_PROVIDER_SITE_OTHER): Payer: BLUE CROSS/BLUE SHIELD

## 2015-09-23 ENCOUNTER — Other Ambulatory Visit: Payer: Self-pay

## 2015-09-23 VITALS — BP 125/84 | HR 77

## 2015-09-23 DIAGNOSIS — E785 Hyperlipidemia, unspecified: Secondary | ICD-10-CM

## 2015-09-23 DIAGNOSIS — I10 Essential (primary) hypertension: Secondary | ICD-10-CM | POA: Diagnosis not present

## 2015-09-23 LAB — LIPID PANEL
Cholesterol: 248 mg/dL — ABNORMAL HIGH (ref 125–200)
HDL: 37 mg/dL — ABNORMAL LOW (ref >=46)
LDL CALC: 150 mg/dL — AB (ref <130)
Total CHOL/HDL Ratio: 6.7 Ratio — ABNORMAL HIGH (ref <=5.0)
Triglycerides: 306 mg/dL — ABNORMAL HIGH (ref <150)
VLDL: 61 mg/dL — ABNORMAL HIGH (ref <30)

## 2015-09-23 NOTE — Patient Instructions (Signed)
Continue medications as prescribed unless Dr Tenny Crawoss changes them      Thank you for choosing [facility] !

## 2015-09-23 NOTE — Progress Notes (Signed)
Patient brought in BP cuff,equal to ours  Patient wants to see neurologist because she had vision chanes and numbness [facility]   Will forward to Dr Tenny Crawoss

## 2015-09-24 LAB — BASIC METABOLIC PANEL
BUN: 9 mg/dL (ref 7–25)
CALCIUM: 9.2 mg/dL (ref 8.6–10.4)
CO2: 26 mmol/L (ref 20–31)
CREATININE: 0.72 mg/dL (ref 0.50–1.05)
Chloride: 106 mmol/L (ref 98–110)
GLUCOSE: 85 mg/dL (ref 65–99)
Potassium: 3.9 mmol/L (ref 3.5–5.3)
Sodium: 140 mmol/L (ref 135–146)

## 2015-09-29 ENCOUNTER — Ambulatory Visit (INDEPENDENT_AMBULATORY_CARE_PROVIDER_SITE_OTHER): Payer: BLUE CROSS/BLUE SHIELD | Admitting: *Deleted

## 2015-09-29 DIAGNOSIS — R002 Palpitations: Secondary | ICD-10-CM

## 2015-09-30 NOTE — Progress Notes (Signed)
Carelink Summary Report / Loop Recorder 

## 2015-10-05 ENCOUNTER — Telehealth: Payer: Self-pay | Admitting: *Deleted

## 2015-10-05 DIAGNOSIS — R2689 Other abnormalities of gait and mobility: Secondary | ICD-10-CM

## 2015-10-05 NOTE — Telephone Encounter (Signed)
-----   Message from Pricilla RifflePaula Ross V, MD sent at 09/24/2015  9:17 AM EST ----- Lipid panel is not good LDL is high at 150  Want 100 or less Triglycerids are high at 306  Goal 150  REcomm Decrease saturated fat intake and carbohydrates WOuld recomm a referral to dietary to review diet  Instruct on changes she can make Would repeat lipids in 9 months

## 2015-10-05 NOTE — Telephone Encounter (Signed)
Please refer to dietary Also set referral with [facility]

## 2015-10-05 NOTE — Telephone Encounter (Signed)
Called patient to inform of lab results/recommendations. She is not interested in a dietary referral at this time and will make suggested changes. She continues lipitor 40 mg daily.  She has 2 concerns:  Will Dr. Tenny Crawoss consider a referral to neurology because she feels that about a week after her hospitalization she was at work and her face became numb and she almost collapsed and since then she has poor balance and her speech "just wont come out right" and she saw an eye dr and was told that she lost some sight that she will never get back.  She is not sure if one/both eyes but it is not glaucoma.  Also--she is not sending transmissions monthly or periodicly for her loop recorder.  She has the equipment at home but is not sure where it will go.   I adv I think the device clinic is getting remote transmissions but told her I will ask the device nurse to contact her for her concerns.

## 2015-10-06 NOTE — Telephone Encounter (Signed)
Referral to neurology completed. Patient states yesterday that she does not want to see dietary at this time.  No dietary referral placed at this time.

## 2015-10-06 NOTE — Telephone Encounter (Signed)
LMOM regarding automatic LINQ transmissions- they are being received. If we need her to send any transmissions manually we will contact her to do so. Advised to call back to office if she has further questions.

## 2015-10-29 ENCOUNTER — Ambulatory Visit (INDEPENDENT_AMBULATORY_CARE_PROVIDER_SITE_OTHER): Payer: BLUE CROSS/BLUE SHIELD | Admitting: *Deleted

## 2015-10-29 DIAGNOSIS — R002 Palpitations: Secondary | ICD-10-CM | POA: Diagnosis not present

## 2015-11-01 NOTE — Progress Notes (Signed)
Carelink Summary Report / Loop Recorder 

## 2015-11-02 ENCOUNTER — Encounter: Payer: Self-pay | Admitting: Neurology

## 2015-11-02 ENCOUNTER — Ambulatory Visit (INDEPENDENT_AMBULATORY_CARE_PROVIDER_SITE_OTHER): Payer: BLUE CROSS/BLUE SHIELD | Admitting: Neurology

## 2015-11-02 VITALS — BP 130/72 | HR 83 | Ht 64.0 in | Wt 127.0 lb

## 2015-11-02 DIAGNOSIS — R479 Unspecified speech disturbances: Secondary | ICD-10-CM | POA: Diagnosis not present

## 2015-11-02 DIAGNOSIS — R404 Transient alteration of awareness: Secondary | ICD-10-CM | POA: Diagnosis not present

## 2015-11-02 DIAGNOSIS — G8190 Hemiplegia, unspecified affecting unspecified side: Secondary | ICD-10-CM | POA: Diagnosis not present

## 2015-11-02 DIAGNOSIS — R55 Syncope and collapse: Secondary | ICD-10-CM

## 2015-11-02 NOTE — Patient Instructions (Addendum)
Your episodes may be either migraine or seizure.   First, I want to perform some tests to find out what it is. 1.  CT and CTA of head 2.  Carotid doppler 3.  EEG 4.  Since you have unexplained recurrent episodes of passing out, I think you shouldn't be driving until we can figure out what is going on  Follow up after testing and we will discuss how to treat this.

## 2015-11-02 NOTE — Progress Notes (Signed)
NEUROLOGY CONSULTATION NOTE  Maria Webster MRN: [id number] DOB: [birth year]  Referring provider: Dr. Tenny Craw Primary care provider: Dr. Iris Pert  Reason for consult:  Syncope, spells  HISTORY OF PRESENT ILLNESS: Maria Webster is a 60 year old right-handed female who presents for balance and vision problems.  History obtained by patient, her sister and cardiology and neurology notes.  She has experienced stereotypical spells since 2003-2005.  They initially occurred once a month but has increased in frequency over the years to now several times a week. Semiology is described as sudden onset with gradual progression of tingling and numbness of right side of face, with drooling and drooping of the right side of her mouth, with paresis of the right arm.  She also experiences "fullness in the head" with lightheadedness, but no headache.  Symptoms last for just a couple of minutes, but she feels "hungover" afterwards.  She feels "hungover" afterwards for a couple of days.  She also has experienced passing out with these spells.  Sometimes she passes out separate from these spells.  About a month ago, she had an episode that was accompanied by vision loss for 5 minutes.  Afterwards, she saw an eye doctor who told her she has permanent vision loss compared to last year, but she is unsure of the diagnosis.  She was told to follow up in a year.  She has been followed by cardiology and vascular neurology at [facility].  Carotid dopplers were unremarkable.  Implantable loop recorder has revealed no VT/VF.  Prior MRI of brain, CTA of head and carotid dopplers have reportedly been unremarkable.  From a neurologic standpoint, complex migraine was most suspected although a cardiac dysrhythmia could not completely be ruled out.  She is followed by cardiology for syncope.  TTE from 01/01/15 was unrevealing with EF 55-60%  She denies prior history of migraines or seizures.  PAST MEDICAL HISTORY: Past Medical History    Diagnosis Date  . Syncope   . Palpitations   . Migraine with aura   . Hypertension   . Dyslipidemia   . Lupus (HCC)     PAST SURGICAL HISTORY: Past Surgical History  Procedure Laterality Date  . Loop recorder implant      Performed by Lynnell Chad, MD at [facility] CARDIAC CATH AND EP LABS  . Spine surgery    . Cesarean section      x2    MEDICATIONS: Current Outpatient Prescriptions on File Prior to Visit  Medication Sig Dispense Refill  . amLODipine (NORVASC) 10 MG tablet Take 1 tablet (10 mg total) by mouth daily. 90 tablet 3  . aspirin EC 81 MG tablet Take 81 mg by mouth daily.    Marland Kitchen atorvastatin (LIPITOR) 40 MG tablet Take 40 mg by mouth every evening.     . Cholecalciferol (VITAMIN D3) 2000 UNITS capsule Take 2,000 Units by mouth daily.    . hydroxychloroquine (PLAQUENIL) 200 MG tablet Take 200 mg by mouth daily.     Marland Kitchen losartan (COZAAR) 100 MG tablet Take 1 tablet (100 mg total) by mouth daily. (Patient taking differently: Take 50 mg by mouth daily. ) 90 tablet 3  . metoprolol (LOPRESSOR) 25 MG tablet Take 1 tablet (25 mg total) by mouth 2 (two) times daily. 60 tablet 11  . vitamin C (ASCORBIC ACID) 500 MG tablet Take 500 mg by mouth daily.     No current facility-administered medications on file prior to visit.    ALLERGIES: Allergies  Allergen Reactions  .  Codeine Swelling    Swelling   . Latex Hives  . Novocain [Procaine] Other (See Comments)    Jerks and shakes real bad "Jerks and shakes real bad"    FAMILY HISTORY: Family History  Problem Relation Age of Onset  . Hypertension Mother   . Heart disease Mother   . Migraines Mother   . Heart disease Sister   . Migraines Sister   . Heart attack Sister   . Heart disease Brother   . Leukemia Brother   . Heart attack Sister   . Heart disease Brother     SOCIAL HISTORY: Social History   Social History  . Marital Status: Married    Spouse Name: N/A  . Number of Children: N/A  . Years of Education: N/A    Occupational History  . Not on file.   Social History Main Topics  . Smoking status: Current Every Day Smoker -- 0.50 packs/day    Types: Cigarettes    Start date: 07/22/1962  . Smokeless tobacco: Never Used  . Alcohol Use: No  . Drug Use: No  . Sexual Activity: Not on file   Other Topics Concern  . Not on file   Social History Narrative    REVIEW OF SYSTEMS: Constitutional: No fevers, chills, or sweats, no generalized fatigue, change in appetite Eyes: No visual changes, double vision, eye pain Ear, nose and throat: No hearing loss, ear pain, nasal congestion, sore throat Cardiovascular: No chest pain, palpitations Respiratory:  No shortness of breath at rest or with exertion, wheezes GastrointestinaI: No nausea, vomiting, diarrhea, abdominal pain, fecal incontinence Genitourinary:  No dysuria, urinary retention or frequency Musculoskeletal:  No neck pain, back pain Integumentary: No rash, pruritus, skin lesions Neurological: as above Psychiatric: No depression, insomnia, anxiety Endocrine: No palpitations, fatigue, diaphoresis, mood swings, change in appetite, change in weight, increased thirst Hematologic/Lymphatic:  No anemia, purpura, petechiae. Allergic/Immunologic: no itchy/runny eyes, nasal congestion, recent allergic reactions, rashes  PHYSICAL EXAM: Filed Vitals:   11/02/15 1412  BP: 130/72  Pulse: 83   General: No acute distress.  Patient appears well-groomed.  Head:  Normocephalic/atraumatic Eyes:  fundi unremarkable, without vessel changes, exudates, hemorrhages or papilledema. Neck: supple, no paraspinal tenderness, full range of motion Back: No paraspinal tenderness Heart: regular rate and rhythm Lungs: Clear to auscultation bilaterally. Vascular: No carotid bruits. Neurological Exam: Mental status: alert and oriented to person, place, and time, recent and remote memory intact, fund of knowledge intact, attention and concentration intact, speech  fluent and not dysarthric, language intact. Cranial nerves: CN I: not tested CN II: pupils equal, round and reactive to light, visual fields intact, fundi unremarkable, without vessel changes, exudates, hemorrhages or papilledema. CN III, IV, VI:  full range of motion, no nystagmus, no ptosis CN V: facial sensation intact CN VII: upper and lower face symmetric CN VIII: hearing intact CN IX, X: gag intact, uvula midline CN XI: sternocleidomastoid and trapezius muscles intact CN XII: tongue midline Bulk & Tone: normal, no fasciculations. Motor:  5/5 throughout  Sensation:  Pinprick and vibration sensation intact. Deep Tendon Reflexes:  2+ throughout, toes downgoing.  Finger to nose testing:  Without dysmetria.  Heel to shin:  Without dysmetria.  Gait:  Normal station and stride.  Able to turn and tandem walk. Romberg negative.  IMPRESSION: Recurrent episodes of right sided numbness and weakness.  TIA unlikely, since these have been recurrent habitual spells.  Seizure or migraine is more likely.  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  With the syncope, need to consider basilar migraine as well.  Syncope.  Occurs with and without these other spells.  PLAN: 1.  First, we will get a CT and CTA of the head to evaluate for any intracranial abnormality or basilar stenosis (she cannot have an MRI due to implantable loop recorder) 2.  We will check carotid doppler 3.  We will check EEG. 4.  I also informed her that she should not be driving, given these are recurrent unexplained episodes of passing out. 5.  Follow up after testing to discuss treatment.  Thank you for allowing me to take part in the care of this patient.  Shon Millet, DO  CC:  Dietrich Pates, MD  Donia Pounds, MD

## 2015-11-03 NOTE — Progress Notes (Signed)
Chart forwarded.  

## 2015-11-05 ENCOUNTER — Ambulatory Visit
Admission: RE | Admit: 2015-11-05 | Discharge: 2015-11-05 | Disposition: A | Discharge status: Home / Self Care | Payer: BLUE CROSS/BLUE SHIELD | Source: Ambulatory Visit | Attending: Neurology | Admitting: Neurology

## 2015-11-05 DIAGNOSIS — G8190 Hemiplegia, unspecified affecting unspecified side: Secondary | ICD-10-CM

## 2015-11-05 DIAGNOSIS — R479 Unspecified speech disturbances: Secondary | ICD-10-CM

## 2015-11-05 DIAGNOSIS — R55 Syncope and collapse: Secondary | ICD-10-CM

## 2015-11-08 ENCOUNTER — Other Ambulatory Visit: Payer: BLUE CROSS/BLUE SHIELD

## 2015-11-08 ENCOUNTER — Ambulatory Visit
Admission: RE | Admit: 2015-11-08 | Discharge: 2015-11-08 | Disposition: A | Discharge status: Home / Self Care | Payer: BLUE CROSS/BLUE SHIELD | Source: Ambulatory Visit | Attending: Neurology | Admitting: Neurology

## 2015-11-08 DIAGNOSIS — R479 Unspecified speech disturbances: Secondary | ICD-10-CM

## 2015-11-08 DIAGNOSIS — R55 Syncope and collapse: Secondary | ICD-10-CM

## 2015-11-08 DIAGNOSIS — G8190 Hemiplegia, unspecified affecting unspecified side: Secondary | ICD-10-CM

## 2015-11-08 LAB — CUP PACEART REMOTE DEVICE CHECK: MDC IDC SESS DTM: 20170214000736

## 2015-11-08 MED ORDER — IOPAMIDOL (ISOVUE-370) INJECTION 76%
100.0000 mL | Freq: Once | INTRAVENOUS | Status: AC | PRN
  Administered 2015-11-08: 100 mL via INTRAVENOUS

## 2015-11-08 NOTE — Progress Notes (Signed)
Carelink summary report received. Battery status OK. Normal device function. No tachy episodes, brady, or pause episodes. No new AF episodes. 9 symptom episodes- available ECGs appear SR PACs, PVCs, PVC couplet noted. Monthly summary reports and ROV/PRN

## 2015-11-09 ENCOUNTER — Telehealth: Payer: Self-pay

## 2015-11-09 NOTE — Telephone Encounter (Signed)
-----   Message from Drema DallasAdam R Jaffe, DO sent at 11/09/2015 12:25 PM EDT ----- CTA and carotid doppler shows some atherosclerosis in the carotid arteries but there is no significant blockage or narrowing of the arteries.

## 2015-11-09 NOTE — Telephone Encounter (Signed)
Left message on machine for pt to return call to the office.  

## 2015-11-09 NOTE — Telephone Encounter (Signed)
Message relayed to patient. Verbalized understanding and denied questions.   

## 2015-11-29 ENCOUNTER — Ambulatory Visit (INDEPENDENT_AMBULATORY_CARE_PROVIDER_SITE_OTHER): Payer: BLUE CROSS/BLUE SHIELD | Admitting: *Deleted

## 2015-11-29 DIAGNOSIS — R002 Palpitations: Secondary | ICD-10-CM | POA: Diagnosis not present

## 2015-11-29 NOTE — Progress Notes (Signed)
Carelink Summary Report / Loop Recorder 

## 2015-12-10 LAB — CUP PACEART REMOTE DEVICE CHECK: MDC IDC SESS DTM: 20170316000611

## 2015-12-12 LAB — CUP PACEART REMOTE DEVICE CHECK: Date Time Interrogation Session: 20170415003806

## 2015-12-12 NOTE — Progress Notes (Signed)
Carelink summary report received. Battery status OK. Normal device function. No new tachy episodes, brady, or pause episodes. No new AF episodes. 4 symptom episodes, SR with PAC's. Monthly summary reports and ROV/PRN

## 2015-12-28 ENCOUNTER — Ambulatory Visit (INDEPENDENT_AMBULATORY_CARE_PROVIDER_SITE_OTHER): Payer: BLUE CROSS/BLUE SHIELD | Admitting: *Deleted

## 2015-12-28 DIAGNOSIS — R002 Palpitations: Secondary | ICD-10-CM | POA: Diagnosis not present

## 2015-12-29 NOTE — Progress Notes (Signed)
Carelink Summary Report / Loop Recorder 

## 2016-01-04 LAB — CUP PACEART REMOTE DEVICE CHECK: MDC IDC SESS DTM: 20170515010645

## 2016-01-26 LAB — CUP PACEART REMOTE DEVICE CHECK: MDC IDC SESS DTM: 20170614013535

## 2016-01-27 ENCOUNTER — Ambulatory Visit (INDEPENDENT_AMBULATORY_CARE_PROVIDER_SITE_OTHER): Payer: BLUE CROSS/BLUE SHIELD | Admitting: *Deleted

## 2016-01-27 DIAGNOSIS — R002 Palpitations: Secondary | ICD-10-CM | POA: Diagnosis not present

## 2016-01-28 NOTE — Progress Notes (Signed)
Carelink Summary Report / Loop Recorder 

## 2016-02-02 ENCOUNTER — Ambulatory Visit: Payer: BLUE CROSS/BLUE SHIELD | Admitting: Neurology

## 2016-02-02 ENCOUNTER — Other Ambulatory Visit: Payer: Self-pay

## 2016-02-02 ENCOUNTER — Encounter: Payer: Self-pay | Admitting: Neurology

## 2016-02-02 ENCOUNTER — Ambulatory Visit (INDEPENDENT_AMBULATORY_CARE_PROVIDER_SITE_OTHER): Payer: BLUE CROSS/BLUE SHIELD | Admitting: Neurology

## 2016-02-02 VITALS — BP 126/74 | HR 85 | Ht 64.0 in | Wt 126.0 lb

## 2016-02-02 DIAGNOSIS — G43109 Migraine with aura, not intractable, without status migrainosus: Secondary | ICD-10-CM

## 2016-02-02 DIAGNOSIS — F17200 Nicotine dependence, unspecified, uncomplicated: Secondary | ICD-10-CM | POA: Diagnosis not present

## 2016-02-02 DIAGNOSIS — G43909 Migraine, unspecified, not intractable, without status migrainosus: Secondary | ICD-10-CM

## 2016-02-02 NOTE — Progress Notes (Signed)
Chart forwarded.  

## 2016-02-02 NOTE — Patient Instructions (Signed)
My suspicion is that these are migraines but seizures are possible.  1.  We need to get an EEG 2.  In the meantime, I will contact Dr. Tenny Crawoss to see if it would be okay to prescribe you a blood pressure medication that helps with these types of headache.  Once I hear from her, I will let you know 3.  Follow up in 3 months.

## 2016-02-02 NOTE — Progress Notes (Signed)
NEUROLOGY FOLLOW UP OFFICE NOTE  Maria Webster [id number]  HISTORY OF PRESENT ILLNESS: Maria Webster is a 60 year old right-handed female with lupus, RA, palpitations and tobacco use who follows up for recurrent episodes of right sided numbness and weakness and syncope.  UPDATE: Carotid doppler from 11/05/15 revealed no hemodynamically significant stenosis.  CTA of head from 11/08/15 was personally reviewed and revealed no significant intracranial stenosis.  EEG was ordered but not performed.  For palpitations, she has been wearing an implantable loop recorder, which has been unremarkable.    She continues to have spells of lightheadedness, right sided weakness and inability to speak.  They occur twice a week.  She denies any further episodes of syncope or transient vision loss.  HISTORY: She has experienced stereotypical spells since 2003-2005.  They initially occurred once a month but has increased in frequency over the years to now several times a week. Semiology is described as sudden onset with gradual progression of tingling and numbness of right side of face, with drooling and drooping of the right side of her mouth, with paresis of the right arm.  She also experiences "fullness in the head" with lightheadedness, but no headache.  Symptoms last for just a couple of minutes, but she feels "hungover" afterwards.  She feels "hungover" afterwards for a couple of days.  She also has experienced passing out with these spells.  Sometimes she passes out separate from these spells.  About a month ago, she had an episode that was accompanied by vision loss for 5 minutes.  Afterwards, she saw an eye doctor who told her she has permanent vision loss compared to last year, but she is unsure of the diagnosis.  She was told to follow up in a year.  She has been followed by cardiology and [facility].  Carotid dopplers were unremarkable.  Implantable loop recorder has revealed no VT/VF.  Prior  MRI of brain, CTA of head and carotid dopplers have reportedly been unremarkable.  From a neurologic standpoint, complex migraine was most suspected although a cardiac dysrhythmia could not completely be ruled out.  She is followed by cardiology for syncope.  TTE from 01/01/15 was unrevealing with EF 55-60%  She denies prior history of migraines or seizures.  PAST MEDICAL HISTORY: Past Medical History  Diagnosis Date  . Syncope   . Palpitations   . Migraine with aura   . Hypertension   . Dyslipidemia   . Lupus (HCC)   . Rheumatoid arteritis     MEDICATIONS: Current Outpatient Prescriptions on File Prior to Visit  Medication Sig Dispense Refill  . amLODipine (NORVASC) 10 MG tablet Take 1 tablet (10 mg total) by mouth daily. 90 tablet 3  . aspirin EC 81 MG tablet Take 81 mg by mouth daily.    Marland Kitchen. atorvastatin (LIPITOR) 40 MG tablet Take 40 mg by mouth every evening.     . Cholecalciferol (VITAMIN D3) 2000 UNITS capsule Take 2,000 Units by mouth daily.    . hydroxychloroquine (PLAQUENIL) 200 MG tablet Take 200 mg by mouth daily.     Marland Kitchen. losartan (COZAAR) 100 MG tablet Take 1 tablet (100 mg total) by mouth daily. (Patient taking differently: Take 50 mg by mouth daily. ) 90 tablet 3  . metoprolol (LOPRESSOR) 25 MG tablet Take 1 tablet (25 mg total) by mouth 2 (two) times daily. 60 tablet 11  . vitamin C (ASCORBIC ACID) 500 MG tablet Take 500 mg by mouth daily.  No current facility-administered medications on file prior to visit.    ALLERGIES: Allergies  Allergen Reactions  . Codeine Swelling    Swelling   . Latex Hives  . Novocain [Procaine] Other (See Comments)    Other reaction(s): Other (See Comments) Jerks and shakes real bad Jerks and shakes real bad "Jerks and shakes real bad"    FAMILY HISTORY: Family History  Problem Relation Age of Onset  . Hypertension Mother   . Heart disease Mother   . Migraines Mother   . Heart disease Sister   . Migraines Sister   . Heart  attack Sister   . Heart disease Brother   . Leukemia Brother   . Heart attack Sister   . Heart disease Brother     SOCIAL HISTORY: Social History   Social History  . Marital Status: Married    Spouse Name: N/A  . Number of Children: N/A  . Years of Education: N/A   Occupational History  . Not on file.   Social History Main Topics  . Smoking status: Current Every Day Smoker -- 0.50 packs/day    Types: Cigarettes    Start date: 07/22/1962  . Smokeless tobacco: Never Used  . Alcohol Use: No  . Drug Use: No  . Sexual Activity: Not on file   Other Topics Concern  . Not on file   Social History Narrative    REVIEW OF SYSTEMS: Constitutional: No fevers, chills, or sweats, no generalized fatigue, change in appetite Eyes: No visual changes, double vision, eye pain Ear, nose and throat: No hearing loss, ear pain, nasal congestion, sore throat Cardiovascular: No chest pain, palpitations Respiratory:  No shortness of breath at rest or with exertion, wheezes GastrointestinaI: No nausea, vomiting, diarrhea, abdominal pain, fecal incontinence Genitourinary:  No dysuria, urinary retention or frequency Musculoskeletal:  No neck pain, back pain Integumentary: No rash, pruritus, skin lesions Neurological: as above Psychiatric: No depression, insomnia, anxiety Endocrine: No palpitations, fatigue, diaphoresis, mood swings, change in appetite, change in weight, increased thirst Hematologic/Lymphatic:  No purpura, petechiae. Allergic/Immunologic: no itchy/runny eyes, nasal congestion, recent allergic reactions, rashes  PHYSICAL EXAM: Filed Vitals:   02/02/16 1044  BP: 126/74  Pulse: 85   General: No acute distress.  Patient appears well-groomed.  normal body habitus. Head:  Normocephalic/atraumatic Eyes:  Fundi examined but not visualized Neck: supple, no paraspinal tenderness, full range of motion Heart:  Regular rate and rhythm Lungs:  Clear to auscultation bilaterally Back:  No paraspinal tenderness Neurological Exam: alert and oriented to person, place, and time. Attention span and concentration intact, recent and remote memory intact, fund of knowledge intact.  Speech fluent and not dysarthric, language intact.  CN II-XII intact. Bulk and tone normal, muscle strength 5/5 throughout.  Sensation to light touch, temperature and vibration intact.  Deep tendon reflexes 2+ throughout, toes downgoing.  Finger to nose testing intact.  Gait normal  IMPRESSION: Recurrent episodes of right sided numbness and weakness.  TIA unlikely, since these have been recurrent habitual spells.  Seizure or migraine is more likely.  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  With the syncope and at least one episode associated with vision loss, need to consider basilar migraine as well.  Tobacco use  PLAN: 1.  Verapamil is a medication that is effective for hemiplegic migraines or migraine with brainstem aura.  I will contact her cardiologist, Dr. Tenny Craw, to see if she has any objection to starting this medication.  2.  We will get an EEG.  If findings are suggestive that these may be seizures, we can switch to an anti-epileptic drug. 3.  Smoking cessation 4.  Follow up in 3 months.  25 minutes spent face to face with patient, over 50% spent discussing diagnosis and management.  Shon Millet, DO  CC:  Dietrich Pates, MD  Donia Pounds, MD

## 2016-02-04 ENCOUNTER — Other Ambulatory Visit: Payer: Self-pay

## 2016-02-04 DIAGNOSIS — IMO0001 Reserved for inherently not codable concepts without codable children: Secondary | ICD-10-CM

## 2016-02-04 DIAGNOSIS — R6889 Other general symptoms and signs: Principal | ICD-10-CM

## 2016-02-04 LAB — CUP PACEART REMOTE DEVICE CHECK: Date Time Interrogation Session: 20170714020832

## 2016-02-04 MED ORDER — VERAPAMIL HCL ER 120 MG PO TBCR: 120.0000 mg | EXTENDED_RELEASE_TABLET | Freq: Every day | ORAL | Status: DC

## 2016-02-04 NOTE — Telephone Encounter (Signed)
Left message for pt to return call to clinic

## 2016-02-04 NOTE — Telephone Encounter (Signed)
-----   Message from Drema DallasAdam R Jaffe, DO sent at 02/04/2016  1:31 PM EDT ----- I had contacted Ms. Flinchbaugh's cardiologist regarding medications.  I would like to start verapamil 120mg  daily.  I want to see if this will help reduce the frequency of her events.  This medication is a calcium channel blocker, which is in the same family as her amlodipine.  Therefore, she should stop her amlodipine.  I would like her to contact us in 4 weeks with update to see if her spells are decreased.  She should still get the EEG as recommended.

## 2016-02-07 NOTE — Telephone Encounter (Signed)
Spoke with patient. RX sent in.  

## 2016-02-28 ENCOUNTER — Ambulatory Visit (INDEPENDENT_AMBULATORY_CARE_PROVIDER_SITE_OTHER): Payer: BLUE CROSS/BLUE SHIELD | Admitting: *Deleted

## 2016-02-28 DIAGNOSIS — R002 Palpitations: Secondary | ICD-10-CM | POA: Diagnosis not present

## 2016-02-28 NOTE — Addendum Note (Signed)
Addended by: Sheilah MinsFOX, JADA A on: 02/28/2016 11:05 AM   Modules accepted: Orders

## 2016-02-28 NOTE — Progress Notes (Signed)
Carelink Summary Report / Loop Recorder 

## 2016-03-01 ENCOUNTER — Ambulatory Visit: Payer: BLUE CROSS/BLUE SHIELD | Admitting: Neurology

## 2016-03-01 ENCOUNTER — Encounter: Payer: Self-pay | Admitting: Neurology

## 2016-03-01 DIAGNOSIS — G43109 Migraine with aura, not intractable, without status migrainosus: Secondary | ICD-10-CM

## 2016-03-01 NOTE — Addendum Note (Signed)
Addended by: Sheilah MinsFOX, Noami Bove A on: 03/01/2016 10:16 AM   Modules accepted: Orders

## 2016-03-13 ENCOUNTER — Ambulatory Visit (INDEPENDENT_AMBULATORY_CARE_PROVIDER_SITE_OTHER): Payer: BLUE CROSS/BLUE SHIELD | Admitting: Neurology

## 2016-03-13 DIAGNOSIS — R6889 Other general symptoms and signs: Secondary | ICD-10-CM | POA: Diagnosis not present

## 2016-03-13 DIAGNOSIS — IMO0001 Reserved for inherently not codable concepts without codable children: Secondary | ICD-10-CM

## 2016-03-14 ENCOUNTER — Telehealth: Payer: Self-pay | Admitting: Neurology

## 2016-03-14 NOTE — Telephone Encounter (Signed)
I spoke with patient and she said that the camera is not working.  It says low battery even when it is plugged up.  She is going to put it back in the case and bring it back with her on Thursday.

## 2016-03-14 NOTE — Telephone Encounter (Signed)
Harle StanfordJanet Faulconer 04/25/1956. She was here Monday 8/29 to have electrodes put on by Darl PikesSusan to wear. She said when she got home it said battery low. She said she plugged it in to try and charge it. She said it still has a black screen. She said she is writing it down and she punches a button. She said she had 2 episodes yesterday. She is a patient of Dr. Moises BloodJaffe's. Her # is [phone]. Thank you

## 2016-03-21 NOTE — Procedures (Signed)
ELECTROENCEPHALOGRAM REPORT  Dates of Recording: 03/13/2016 to 03/16/2016  Patient's Name: Maria Webster MRN: [id number] Date of Birth: 1956-02-28   Procedure: 72-hour ambulatory EEG  History: 60 year old woman with stereo-typical recurrent spells of right sided numbness and weakness.  Medications: gabapentin, Lipitor, Plaquenil, metoprolol, Fosamax  Technical Summary: This is a 72-hour multichannel digital EEG recording measured by the international 10-20 system with electrodes applied with paste and impedances below 5000 ohms performed as portable with EKG monitoring.  The digital EEG was referentially recorded, reformatted, and digitally filtered in a variety of bipolar and referential montages for optimal display.    DESCRIPTION OF RECORDING: During maximal wakefulness, the background activity consisted of a symmetric 10Hz  posterior dominant rhythm which was reactive to eye opening.  There were no epileptiform discharges or focal slowing seen in wakefulness.  During the recording, the patient progresses through wakefulness, drowsiness, and Stage 2 sleep.  Again, there were no epileptiform discharges seen.  Events: On two occasions, she had episode of speech disturbance.  On two occasions, she had episode of dizziness,one time associated with facial tingling. There were no electrographic seizures seen.  EKG lead was unremarkable.  IMPRESSION: This 72-hour ambulatory EEG study is normal.    CLINICAL CORRELATION: The episodes of speech disturbance and dizziness do not appear to be epileptogenic.     Shon MilletAdam Jaffe, DO

## 2016-03-23 ENCOUNTER — Telehealth: Payer: Self-pay

## 2016-03-23 NOTE — Telephone Encounter (Signed)
-----   Message from Drema DallasAdam R Jaffe, DO sent at 03/23/2016  7:03 AM EDT ----- Ambulatory eeg was normal.  We can discuss further at her follow-up

## 2016-03-23 NOTE — Telephone Encounter (Signed)
Attempted to reach pt. Line continuously rang.  

## 2016-03-24 NOTE — Telephone Encounter (Signed)
Attempted to reach pt. No answer.

## 2016-03-27 ENCOUNTER — Ambulatory Visit (INDEPENDENT_AMBULATORY_CARE_PROVIDER_SITE_OTHER): Payer: BLUE CROSS/BLUE SHIELD | Admitting: *Deleted

## 2016-03-27 DIAGNOSIS — R002 Palpitations: Secondary | ICD-10-CM | POA: Diagnosis not present

## 2016-03-27 LAB — CUP PACEART REMOTE DEVICE CHECK: MDC IDC SESS DTM: 20170813023721

## 2016-03-27 NOTE — Progress Notes (Signed)
Carelink summary report received. Battery status OK. Normal device function. Good histograms. No new tachy episodes, brady, or pause episodes. No new AF episodes. 1 symptom episode ECG appears SR throughout. Monthly summary reports and ROV/PRN

## 2016-03-28 NOTE — Progress Notes (Signed)
Carelink Summary Report / Loop Recorder 

## 2016-04-14 ENCOUNTER — Encounter: Payer: Self-pay | Admitting: Neurology

## 2016-04-14 ENCOUNTER — Ambulatory Visit (INDEPENDENT_AMBULATORY_CARE_PROVIDER_SITE_OTHER): Payer: BLUE CROSS/BLUE SHIELD | Admitting: Neurology

## 2016-04-14 VITALS — BP 128/68 | HR 53 | Ht 64.0 in | Wt 130.7 lb

## 2016-04-14 DIAGNOSIS — G43909 Migraine, unspecified, not intractable, without status migrainosus: Secondary | ICD-10-CM

## 2016-04-14 DIAGNOSIS — G43109 Migraine with aura, not intractable, without status migrainosus: Secondary | ICD-10-CM

## 2016-04-14 DIAGNOSIS — F17200 Nicotine dependence, unspecified, uncomplicated: Secondary | ICD-10-CM | POA: Diagnosis not present

## 2016-04-14 DIAGNOSIS — R6889 Other general symptoms and signs: Secondary | ICD-10-CM

## 2016-04-14 DIAGNOSIS — IMO0001 Reserved for inherently not codable concepts without codable children: Secondary | ICD-10-CM

## 2016-04-14 MED ORDER — NORTRIPTYLINE HCL 25 MG PO CAPS: 25.0000 mg | ORAL_CAPSULE | Freq: Every day | ORAL | 0 refills | Status: DC

## 2016-04-14 NOTE — Patient Instructions (Signed)
I still am working on the suspicion that these spells are migraines. 1.  We will start nortriptyline 25mg  at bedtime, which is an antidepressant used to treat migraines.  Contact me in 4 weeks with update and we can adjust dose if needed.  Contact me sooner  2.  Follow up in 3 months.

## 2016-04-14 NOTE — Progress Notes (Signed)
Chart forwarded.  

## 2016-04-14 NOTE — Progress Notes (Signed)
NEUROLOGY FOLLOW UP OFFICE NOTE  Znya Albino [id number]  HISTORY OF PRESENT ILLNESS: Helayne Metsker is a 60 year old right-handed female with lupus, RA, palpitations and tobacco use who follows up for recurrent episodes of right sided numbness and weakness and syncope.  She is accompanied by her husband who supplements history.   UPDATE: She had a 72 hour ambulatory EEG in August, which captured 2 habitual events of speech disturbance as well as two episodes of dizziness with facial tingling, both without electrographic correlate.  She did not tolerate verapamil.   She has a spell about twice a day.  She feels lightheaded and has right facial droop and arm weakness.   HISTORY: She has experienced stereotypical spells since 2003-2005.  They initially occurred once a month but has increased in frequency over the years to now several times a week. Semiology is described as sudden onset with gradual progression of tingling and numbness of right side of face, with drooling and drooping of the right side of her mouth, with paresis of the right arm.  She also experiences "fullness in the head" with lightheadedness, but no headache.  Symptoms last for just a couple of minutes, but she feels "hungover" afterwards.  She feels "hungover" afterwards for a couple of days.  She also has experienced passing out with these spells.  Sometimes she passes out separate from these spells.  About a month ago, she had an episode that was accompanied by vision loss for 5 minutes.  Afterwards, she saw an eye doctor who told her she has permanent vision loss compared to last year, but she is unsure of the diagnosis.  She was told to follow up in a year.   She has been followed by cardiology and vascular neurology at [facility].  Carotid dopplers were unremarkable.  Implantable loop recorder has revealed no VT/VF.  Prior MRI of brain, CTA of head and carotid dopplers have reportedly been unremarkable.  Carotid doppler from  11/05/15 revealed no hemodynamically significant stenosis.  CTA of head from 11/08/15 was personally reviewed and revealed no significant intracranial stenosis.  EEG was ordered but not performed.  For palpitations, she has been wearing an implantable loop recorder, which has been unremarkable.    From a neurologic standpoint, complex migraine was most suspected although a cardiac dysrhythmia could not completely be ruled out.   She is followed by cardiology for syncope.  TTE from 01/01/15 was unrevealing with EF 55-60%   She denies prior history of migraines or seizures.  PAST MEDICAL HISTORY: Past Medical History:  Diagnosis Date  . Dyslipidemia   . Hypertension   . Lupus (HCC)   . Migraine with aura   . Palpitations   . Rheumatoid arteritis   . Syncope     MEDICATIONS: Current Outpatient Prescriptions on File Prior to Visit  Medication Sig Dispense Refill  . alendronate (FOSAMAX) 70 MG tablet Take 70 mg by mouth once a week.  0  . amLODipine (NORVASC) 10 MG tablet Take 1 tablet (10 mg total) by mouth daily. 90 tablet 3  . aspirin EC 81 MG tablet Take 81 mg by mouth daily.    Marland Kitchen atorvastatin (LIPITOR) 40 MG tablet Take 40 mg by mouth every evening.     . Cholecalciferol (VITAMIN D3) 2000 UNITS capsule Take 2,000 Units by mouth daily.    Marland Kitchen gabapentin (NEURONTIN) 300 MG capsule     . hydroxychloroquine (PLAQUENIL) 200 MG tablet Take 200 mg by mouth daily.     Marland Kitchen  losartan (COZAAR) 100 MG tablet Take 1 tablet (100 mg total) by mouth daily. (Patient taking differently: Take 50 mg by mouth daily. ) 90 tablet 3  . metoprolol (LOPRESSOR) 25 MG tablet Take 1 tablet (25 mg total) by mouth 2 (two) times daily. 60 tablet 11  . vitamin C (ASCORBIC ACID) 500 MG tablet Take 500 mg by mouth daily.    . verapamil (CALAN-SR) 120 MG CR tablet Take 1 tablet (120 mg total) by mouth at bedtime. (Patient not taking: Reported on 04/14/2016) 30 tablet 1   No current facility-administered medications on file  prior to visit.     ALLERGIES: Allergies  Allergen Reactions  . Codeine Swelling    Swelling   . Latex Hives  . Novocain [Procaine] Other (See Comments)    Other reaction(s): Other (See Comments) Jerks and shakes real bad Jerks and shakes real bad "Jerks and shakes real bad"    FAMILY HISTORY: Family History  Problem Relation Age of Onset  . Hypertension Mother   . Heart disease Mother   . Migraines Mother   . Heart disease Sister   . Migraines Sister   . Heart attack Sister   . Heart disease Brother   . Leukemia Brother   . Heart attack Sister   . Heart disease Brother     SOCIAL HISTORY: Social History   Social History  . Marital status: Married    Spouse name: N/A  . Number of children: N/A  . Years of education: N/A   Occupational History  . Not on file.   Social History Main Topics  . Smoking status: Current Every Day Smoker    Packs/day: 0.50    Types: Cigarettes    Start date: 07/22/1962  . Smokeless tobacco: Never Used  . Alcohol use No  . Drug use: No  . Sexual activity: Not on file   Other Topics Concern  . Not on file   Social History Narrative  . No narrative on file    REVIEW OF SYSTEMS: Constitutional: No fevers, chills, or sweats, no generalized fatigue, change in appetite Eyes: No visual changes, double vision, eye pain Ear, nose and throat: No hearing loss, ear pain, nasal congestion, sore throat Cardiovascular: No chest pain, palpitations Respiratory:  No shortness of breath at rest or with exertion, wheezes GastrointestinaI: No nausea, vomiting, diarrhea, abdominal pain, fecal incontinence Genitourinary:  No dysuria, urinary retention or frequency Musculoskeletal:  No neck pain, back pain Integumentary: No rash, pruritus, skin lesions Neurological: as above Psychiatric: No depression, insomnia, anxiety Endocrine: No palpitations, fatigue, diaphoresis, mood swings, change in appetite, change in weight, increased  thirst Hematologic/Lymphatic:  No purpura, petechiae. Allergic/Immunologic: no itchy/runny eyes, nasal congestion, recent allergic reactions, rashes  PHYSICAL EXAM: Vitals:   04/14/16 1454  BP: 128/68  Pulse: (!) 53   General: No acute distress.  Patient appears well-groomed.  normal body habitus. Head:  Normocephalic/atraumatic  IMPRESSION: Spells.  Possibly complicated migraines.  They frequent habitual spells occurring over 10 years makes TIA unlikely.  I don't suspect seizure at this point. Tobacco use  PLAN: 1.  We will try nortriptyline 25mg  at bedtime and may increase dose in 4 weeks if needed.  Other option includes venlafaxine.  If these are not effective, I would try Depakote.  She is already on a beta blocker (metoprolol).  She has history of kidneys stones requiring surgery, and still has one, so I do not want to use topamax. 2.  Follow up in 3  months. 3.  Smoking cessation  15 minutes spent face to face with patient, 100% spent counseling.  Shon MilletAdam Aprile Dickenson, DO  CC:  Donia PoundsSharukh Shroff, MD

## 2016-04-22 LAB — CUP PACEART REMOTE DEVICE CHECK: MDC IDC SESS DTM: 20170912030626

## 2016-04-22 NOTE — Progress Notes (Signed)
Carelink summary report received. Battery status OK. Normal device function. No new symptom episodes, tachy episodes, brady, or pause episodes. No new AF episodes. Monthly summary reports and ROV/PRN 

## 2016-04-25 ENCOUNTER — Other Ambulatory Visit: Payer: Self-pay | Admitting: Internal Medicine

## 2016-04-25 MED ORDER — METOPROLOL TARTRATE 25 MG PO TABS: 25.0000 mg | ORAL_TABLET | Freq: Two times a day (BID) | ORAL | 3 refills | Status: DC

## 2016-04-25 NOTE — Telephone Encounter (Signed)
Pt is out of one of her medications and is not sure which one it is, she thinks it's her  metoprolol (LOPRESSOR) 50 MG tablet [[id number]][[id number]] DISCONTINUED  Please give her a call

## 2016-04-25 NOTE — Telephone Encounter (Signed)
Refilled lopressor 25 mg bid per MD order

## 2016-04-26 ENCOUNTER — Ambulatory Visit (INDEPENDENT_AMBULATORY_CARE_PROVIDER_SITE_OTHER): Payer: BLUE CROSS/BLUE SHIELD | Admitting: *Deleted

## 2016-04-26 DIAGNOSIS — R002 Palpitations: Secondary | ICD-10-CM

## 2016-04-27 NOTE — Progress Notes (Signed)
Carelink Summary Repot / Loop Recorder 

## 2016-05-01 ENCOUNTER — Telehealth: Payer: Self-pay

## 2016-05-01 MED ORDER — NORTRIPTYLINE HCL 25 MG PO CAPS: 25.0000 mg | ORAL_CAPSULE | Freq: Every day | ORAL | 1 refills | Status: DC

## 2016-05-01 NOTE — Telephone Encounter (Signed)
Pt called with update on Nortriptyline 25 mg. Pt stated that she has only had 1 severe spell since starting medication and zero spells in the last week and a half. Pt [facility] day [facility]

## 2016-05-01 NOTE — Telephone Encounter (Signed)
okay

## 2016-05-04 ENCOUNTER — Ambulatory Visit (INDEPENDENT_AMBULATORY_CARE_PROVIDER_SITE_OTHER): Payer: BLUE CROSS/BLUE SHIELD | Admitting: Adult Health

## 2016-05-04 ENCOUNTER — Encounter: Payer: Self-pay | Admitting: Adult Health

## 2016-05-04 VITALS — BP 128/80 | HR 108 | Ht 64.0 in | Wt 128.0 lb

## 2016-05-04 DIAGNOSIS — R55 Syncope and collapse: Secondary | ICD-10-CM

## 2016-05-04 DIAGNOSIS — I10 Essential (primary) hypertension: Secondary | ICD-10-CM

## 2016-05-04 MED ORDER — LOSARTAN POTASSIUM 100 MG PO TABS: 100.0000 mg | ORAL_TABLET | Freq: Every day | ORAL | 3 refills | Status: AC

## 2016-05-04 MED ORDER — AMLODIPINE BESYLATE 10 MG PO TABS: 10.0000 mg | ORAL_TABLET | Freq: Every day | ORAL | 3 refills | Status: AC

## 2016-05-04 MED ORDER — METOPROLOL TARTRATE 25 MG PO TABS: 25.0000 mg | ORAL_TABLET | Freq: Two times a day (BID) | ORAL | 3 refills | Status: AC

## 2016-05-04 NOTE — Patient Instructions (Signed)
Your physician wants you to follow-up in: 1 Year with Dr. Ross. You will receive a reminder letter in the mail two months in advance. If you don't receive a letter, please call our office to schedule the follow-up appointment.  Your physician recommends that you continue on your current medications as directed. Please refer to the Current Medication list given to you today.  If you need a refill on your cardiac medications before your next appointment, please call your pharmacy.  Thank you for choosing [facility]!   

## 2016-05-04 NOTE — Progress Notes (Signed)
Cardiology Office Note   Date:  05/04/2016   ID:  Dorris Pierre, DOB 22-Aug-1955, MRN [id number]  PCP:  Georgiann Hahn, MD  Cardiologist: Ross/  Joni Reining, NP   Chief Complaint  Patient presents with  . Loss of Consciousness      History of Present Illness: Maria Webster is a 60 y.o. female who presents for ongoing assessment and management of palpitations, history of dizziness and syncope. She was last seen by Dr. Tenny Craw in January 2017. Metoprolol was decreased to 25 mg daily. A loop recorder had been implanted with follow-up interrogations. Repeated interrogations revealed normal sinus rhythm.   She has since been seen by neurologist Dr. Mikki Santee and diagnosed with migraines which were causing her syncope. She has been placed on nortriptyline and symptoms have been eliminated. She is feeling great has no complaints of any recurrences, chest pain, dizziness, or palpitations.    Past Medical History:  Diagnosis Date  . Dyslipidemia   . Hypertension   . Lupus   . Migraine with aura   . Palpitations   . Rheumatoid arteritis   . Syncope     Past Surgical History:  Procedure Laterality Date  . CESAREAN SECTION     x2  . LOOP RECORDER IMPLANT     Performed by Lynnell Chad, MD at [facility] CARDIAC CATH AND EP LABS  . SPINE SURGERY       Current Outpatient Prescriptions  Medication Sig Dispense Refill  . alendronate (FOSAMAX) 70 MG tablet Take 70 mg by mouth once a week.  0  . amLODipine (NORVASC) 10 MG tablet Take 1 tablet (10 mg total) by mouth daily. 90 tablet 3  . aspirin EC 81 MG tablet Take 81 mg by mouth daily.    Marland Kitchen atorvastatin (LIPITOR) 40 MG tablet Take 40 mg by mouth every evening.     . Cholecalciferol (VITAMIN D3) 2000 UNITS capsule Take 2,000 Units by mouth daily.    Marland Kitchen gabapentin (NEURONTIN) 300 MG capsule     . hydroxychloroquine (PLAQUENIL) 200 MG tablet Take 200 mg by mouth daily.     Marland Kitchen losartan (COZAAR) 100 MG tablet Take 1 tablet (100 mg total) by mouth  daily. 90 tablet 3  . metoprolol tartrate (LOPRESSOR) 25 MG tablet Take 1 tablet (25 mg total) by mouth 2 (two) times daily. 180 tablet 3  . nortriptyline (PAMELOR) 25 MG capsule Take 1 capsule (25 mg total) by mouth at bedtime. 90 capsule 1  . vitamin C (ASCORBIC ACID) 500 MG tablet Take 500 mg by mouth daily.     No current facility-administered medications for this visit.     Allergies:   Codeine; Latex; and Novocain [procaine]    Social History:  The patient  reports that she has been smoking Cigarettes.  She started smoking about 53 years ago. She has been smoking about 0.50 packs per day. She has never used smokeless tobacco. She reports that she does not drink alcohol or use drugs.   Family History:  The patient's family history includes Heart attack in her sister and sister; Heart disease in her brother, brother, mother, and sister; Hypertension in her mother; Leukemia in her brother; Migraines in her mother and sister.    ROS: All other systems are reviewed and negative. Unless otherwise mentioned in H&P    PHYSICAL EXAM: VS:  BP 128/80   Pulse (!) 108   Ht 5\' 4"  (1.626 m)   Wt 128 lb (58.1 kg)   SpO2 97%  BMI 21.97 kg/m  , BMI Body mass index is 21.97 kg/m. GEN: Well nourished, well developed, in no acute distress  HEENT: normal  Neck: no JVD, carotid bruits, or masses Cardiac: RRR; no murmurs, rubs, or gallops,no edema  Respiratory:  clear to auscultation bilaterally, normal work of breathing GI: soft, nontender, nondistended, + BS MS: no deformity or atrophy  Skin: warm and dry, no rash Neuro:  Strength and sensation are intact Psych: euthymic mood, full affect  Recent Labs: 09/23/2015: BUN 9; Creat 0.72; Potassium 3.9; Sodium 140    Lipid Panel    Component Value Date/Time   CHOL 248 (H) 09/23/2015 0852   TRIG 306 (H) 09/23/2015 0852   HDL 37 (L) 09/23/2015 0852   CHOLHDL 6.7 (H) 09/23/2015 0852   VLDL 61 (H) 09/23/2015 0852   LDLCALC 150 (H) 09/23/2015  0852      Wt Readings from Last 3 Encounters:  05/04/16 128 lb (58.1 kg)  04/14/16 130 lb 11.2 oz (59.3 kg)  02/02/16 126 lb (57.2 kg)      ASSESSMENT AND PLAN:  1.  Syncopal episodes: Loop recorder remains uneventful revealing normal sinus rhythm. She states she has been diagnosed with migraines causing syncope by neurology. She has now been placed on nortriptyline and has had no further symptoms.  2. Hypertension: Excellent control of blood pressure. No changes.  3. Hypercholesterolemia: Remains on statin therapy with follow-up labs. This will be completed by PCP.Marland Kitchen.    Current medicines are reviewed at length with the patient today.    Labs/ tests ordered today include: None  Orders Placed This Encounter  Procedures  . EKG 12-Lead     Disposition:   FU with One year   Signed, Joni ReiningKathryn Andretta Ergle, NP  05/04/2016 5:17 PM    [facility] [address], [place], [place] [address] Phone: [phone]; Fax: [phone]

## 2016-05-04 NOTE — Progress Notes (Signed)
Name: Maria Webster    DOB: 10/16/1955  Age: 60 y.o.  MR#: [id number]       PCP:  Georgiann HahnSHROFF,SHARUKH D, MD      Insurance: Payor: BLUE CROSS BLUE SHIELD / Plan: BCBS OTHER / Product Type: *No Product type* /   CC:   No chief complaint on file.   VS Vitals:   05/04/16 1601  Weight: 128 lb (58.1 kg)  Height: 5\' 4"  (1.626 m)    Weights Current Weight  05/04/16 128 lb (58.1 kg)  04/14/16 130 lb 11.2 oz (59.3 kg)  02/02/16 126 lb (57.2 kg)    Blood Pressure  BP Readings from Last 3 Encounters:  04/14/16 128/68  02/02/16 126/74  11/02/15 130/72     Admit date:  (Not on file) Last encounter with RMR:  Visit date not found   Allergy Codeine; Latex; and Novocain [procaine]  Current Outpatient Prescriptions  Medication Sig Dispense Refill  . alendronate (FOSAMAX) 70 MG tablet Take 70 mg by mouth once a week.  0  . amLODipine (NORVASC) 10 MG tablet Take 1 tablet (10 mg total) by mouth daily. 90 tablet 3  . aspirin EC 81 MG tablet Take 81 mg by mouth daily.    Marland Kitchen. atorvastatin (LIPITOR) 40 MG tablet Take 40 mg by mouth every evening.     . Cholecalciferol (VITAMIN D3) 2000 UNITS capsule Take 2,000 Units by mouth daily.    Marland Kitchen. gabapentin (NEURONTIN) 300 MG capsule     . hydroxychloroquine (PLAQUENIL) 200 MG tablet Take 200 mg by mouth daily.     Marland Kitchen. losartan (COZAAR) 100 MG tablet Take 1 tablet (100 mg total) by mouth daily. (Patient taking differently: Take 50 mg by mouth daily. ) 90 tablet 3  . metoprolol tartrate (LOPRESSOR) 25 MG tablet Take 1 tablet (25 mg total) by mouth 2 (two) times daily. 60 tablet 3  . nortriptyline (PAMELOR) 25 MG capsule Take 1 capsule (25 mg total) by mouth at bedtime. 90 capsule 1  . vitamin C (ASCORBIC ACID) 500 MG tablet Take 500 mg by mouth daily.     No current facility-administered medications for this visit.     Discontinued Meds:    Medications Discontinued During This Encounter  Medication Reason  . verapamil (CALAN-SR) 120 MG CR tablet Error     Patient Active Problem List   Diagnosis Date Noted  . Family history of cardiac disorder 06/08/2014  . Awareness of heartbeats 06/08/2014  . Near syncope 06/08/2014  . Dyslipidemia 08/08/2013  . BP (high blood pressure) 08/08/2013  . Acute onset aura migraine 08/08/2013  . Current smoker 08/08/2013    LABS    Component Value Date/Time   NA 140 09/23/2015 1203   NA 141 02/24/2015 1814   K 3.9 09/23/2015 1203   K 3.6 02/24/2015 1814   CL 106 09/23/2015 1203   CL 106 02/24/2015 1814   CO2 26 09/23/2015 1203   CO2 26 02/24/2015 1814   GLUCOSE 85 09/23/2015 1203   GLUCOSE 98 02/24/2015 1814   BUN 9 09/23/2015 1203   BUN 12 02/24/2015 1814   CREATININE 0.72 09/23/2015 1203   CREATININE 0.84 02/24/2015 1814   CALCIUM 9.2 09/23/2015 1203   CALCIUM 9.7 02/24/2015 1814   GFRNONAA >60 02/24/2015 1814   GFRAA >60 02/24/2015 1814   CMP     Component Value Date/Time   NA 140 09/23/2015 1203   K 3.9 09/23/2015 1203   CL 106 09/23/2015 1203   CO2 26  09/23/2015 1203   GLUCOSE 85 09/23/2015 1203   BUN 9 09/23/2015 1203   CREATININE 0.72 09/23/2015 1203   CALCIUM 9.2 09/23/2015 1203   GFRNONAA >60 02/24/2015 1814   GFRAA >60 02/24/2015 1814       Component Value Date/Time   WBC 7.6 02/24/2015 1814   HGB 13.6 02/24/2015 1814   HCT 39.3 02/24/2015 1814   MCV 94.5 02/24/2015 1814    Lipid Panel     Component Value Date/Time   CHOL 248 (H) 09/23/2015 0852   TRIG 306 (H) 09/23/2015 0852   HDL 37 (L) 09/23/2015 0852   CHOLHDL 6.7 (H) 09/23/2015 0852   VLDL 61 (H) 09/23/2015 0852   LDLCALC 150 (H) 09/23/2015 0852    ABG No results found for: PHART, PCO2ART, PO2ART, HCO3, TCO2, ACIDBASEDEF, O2SAT   No results found for: TSH BNP (last 3 results) No results for input(s): BNP in the last 8760 hours.  ProBNP (last 3 results) No results for input(s): PROBNP in the last 8760 hours.  Cardiac Panel (last 3 results) No results for input(s): CKTOTAL, CKMB, TROPONINI,  RELINDX in the last 72 hours.  Iron/TIBC/Ferritin/ %Sat No results found for: IRON, TIBC, FERRITIN, IRONPCTSAT   EKG Orders placed or performed in visit on 05/04/16  . EKG 12-Lead     Prior Assessment and Plan Problem List as of 05/04/2016 Reviewed: 04/14/2016  3:21 PM by Cira Servant, DO     Cardiovascular and Mediastinum   BP (high blood pressure)   Acute onset aura migraine   Near syncope     Other   Dyslipidemia   Family history of cardiac disorder   Awareness of heartbeats   Current smoker       Imaging: No results found.

## 2016-05-22 ENCOUNTER — Ambulatory Visit: Payer: BLUE CROSS/BLUE SHIELD | Admitting: Internal Medicine

## 2016-05-26 ENCOUNTER — Ambulatory Visit (INDEPENDENT_AMBULATORY_CARE_PROVIDER_SITE_OTHER): Payer: BLUE CROSS/BLUE SHIELD | Admitting: *Deleted

## 2016-05-26 DIAGNOSIS — R002 Palpitations: Secondary | ICD-10-CM | POA: Diagnosis not present

## 2016-05-26 NOTE — Progress Notes (Signed)
os 

## 2016-05-28 LAB — CUP PACEART REMOTE DEVICE CHECK
Date Time Interrogation Session: 20171012033750
MDC IDC PG IMPLANT DT: 20151223

## 2016-05-28 NOTE — Progress Notes (Signed)
[facility] summary report received. Battery status OK. Normal device function. No new tachy episodes, brady, or pause episodes. No new AF episodes. 1 symptom episode, SR.  Monthly summary reports and ROV/PRN 

## 2016-05-29 NOTE — Progress Notes (Signed)
Carelink Summary Report / Loop Recorder 

## 2016-06-26 ENCOUNTER — Ambulatory Visit (INDEPENDENT_AMBULATORY_CARE_PROVIDER_SITE_OTHER): Payer: BLUE CROSS/BLUE SHIELD | Admitting: *Deleted

## 2016-06-26 DIAGNOSIS — R002 Palpitations: Secondary | ICD-10-CM

## 2016-06-27 NOTE — Progress Notes (Signed)
Carelink Summary Report / Loop Recorder 

## 2016-07-12 LAB — CUP PACEART REMOTE DEVICE CHECK
Date Time Interrogation Session: 20171111041207
MDC IDC PG IMPLANT DT: 20151223

## 2016-07-19 ENCOUNTER — Telehealth: Payer: Self-pay | Admitting: Neurology

## 2016-07-19 MED ORDER — NORTRIPTYLINE HCL 25 MG PO CAPS: 25.0000 mg | ORAL_CAPSULE | Freq: Every day | ORAL | 1 refills | Status: DC

## 2016-07-19 NOTE — Telephone Encounter (Signed)
PT called and said she needed a prescription called in for her Nortriptyline to [facility] on [address] [phone]

## 2016-07-25 ENCOUNTER — Ambulatory Visit (INDEPENDENT_AMBULATORY_CARE_PROVIDER_SITE_OTHER): Payer: BLUE CROSS/BLUE SHIELD | Admitting: *Deleted

## 2016-07-25 DIAGNOSIS — R002 Palpitations: Secondary | ICD-10-CM

## 2016-07-26 NOTE — Progress Notes (Signed)
Carelink Summary Report / Loop Recorder 

## 2016-08-02 ENCOUNTER — Ambulatory Visit: Payer: BLUE CROSS/BLUE SHIELD | Admitting: Neurology

## 2016-08-15 LAB — CUP PACEART REMOTE DEVICE CHECK
Implantable Pulse Generator Implant Date: 20151223
MDC IDC SESS DTM: 20171211043908

## 2016-08-15 NOTE — Progress Notes (Signed)
Carelink summary report received. Battery status OK. Normal device function. No new symptom episodes, tachy episodes, brady, or pause episodes. No new AF episodes. Monthly summary reports and ROV/PRN 

## 2016-08-24 ENCOUNTER — Ambulatory Visit (INDEPENDENT_AMBULATORY_CARE_PROVIDER_SITE_OTHER): Payer: Self-pay | Admitting: *Deleted

## 2016-08-24 DIAGNOSIS — R002 Palpitations: Secondary | ICD-10-CM

## 2016-08-25 NOTE — Progress Notes (Signed)
Carelink Summary Report / Loop Recorder 

## 2016-09-06 LAB — CUP PACEART REMOTE DEVICE CHECK
Date Time Interrogation Session: 20180110050713
Implantable Pulse Generator Implant Date: 20151223

## 2016-09-17 LAB — CUP PACEART REMOTE DEVICE CHECK
Date Time Interrogation Session: 20180209050826
MDC IDC PG IMPLANT DT: 20151223

## 2016-09-17 NOTE — Progress Notes (Signed)
Carelink summary report received. Battery status OK. Normal device function. No new symptom episodes, tachy episodes, brady, or pause episodes. No new AF episodes. Monthly summary reports and ROV/PRN 

## 2016-09-25 ENCOUNTER — Ambulatory Visit (INDEPENDENT_AMBULATORY_CARE_PROVIDER_SITE_OTHER): Payer: Self-pay | Admitting: *Deleted

## 2016-09-25 DIAGNOSIS — R002 Palpitations: Secondary | ICD-10-CM

## 2016-09-26 NOTE — Progress Notes (Signed)
Carelink Summary Report / Loop Recorder 

## 2016-09-30 LAB — CUP PACEART REMOTE DEVICE CHECK
Date Time Interrogation Session: 20180311054142
MDC IDC PG IMPLANT DT: 20151223

## 2016-09-30 NOTE — Progress Notes (Signed)
Carelink summary report received. Battery status OK. Normal device function. No new symptom episodes, tachy episodes, brady, or pause episodes. No new AF episodes. Monthly summary reports and ROV/PRN 

## 2016-10-24 ENCOUNTER — Ambulatory Visit (INDEPENDENT_AMBULATORY_CARE_PROVIDER_SITE_OTHER): Payer: Self-pay | Admitting: *Deleted

## 2016-10-24 DIAGNOSIS — R002 Palpitations: Secondary | ICD-10-CM

## 2016-10-24 NOTE — Progress Notes (Signed)
Carelink Summary Report / Loop Recorder 

## 2016-11-03 LAB — CUP PACEART REMOTE DEVICE CHECK
Date Time Interrogation Session: 20180410060641
MDC IDC PG IMPLANT DT: 20151223

## 2016-11-23 ENCOUNTER — Ambulatory Visit (INDEPENDENT_AMBULATORY_CARE_PROVIDER_SITE_OTHER): Payer: Self-pay | Admitting: *Deleted

## 2016-11-23 DIAGNOSIS — R002 Palpitations: Secondary | ICD-10-CM

## 2016-11-24 NOTE — Progress Notes (Signed)
Carelink Summary Report / Loop Recorder 

## 2016-11-30 LAB — CUP PACEART REMOTE DEVICE CHECK
Date Time Interrogation Session: 20180510193937
Implantable Pulse Generator Implant Date: 20151223

## 2016-12-25 ENCOUNTER — Ambulatory Visit (INDEPENDENT_AMBULATORY_CARE_PROVIDER_SITE_OTHER): Payer: Self-pay | Admitting: *Deleted

## 2016-12-25 DIAGNOSIS — R002 Palpitations: Secondary | ICD-10-CM

## 2016-12-25 NOTE — Progress Notes (Signed)
Carelink Summary Report / Loop Recorder 

## 2016-12-26 ENCOUNTER — Telehealth: Payer: Self-pay | Admitting: Neurology

## 2016-12-26 NOTE — Telephone Encounter (Signed)
Caller: Marylu LundJanet  Urgent? Yes  Reason for the call: She needs an appeal sent in from Dr. Everlena CooperJaffe a long with the one she sent in. She needs him to write and Appeal saying that the EEG was needed. The EEG is costing her about 4,000. She also cannot be seen again until this is fixed. She said she does not have anymore of her medication and she feels her speech is being affected. Please Advise. Thanks

## 2016-12-27 NOTE — Telephone Encounter (Signed)
Please advise 

## 2016-12-27 NOTE — Telephone Encounter (Signed)
PT left a message she was returning a missed call

## 2016-12-27 NOTE — Telephone Encounter (Signed)
We will definitely put in an appeal.  The EEG was definitely indicated and there is no reason that it should not be covered.   We can refill her nortriptyline 25mg  at bedtime.  However, she must be seen in the office for further refills.  I suggest she make an appointment now and we can send in a prescription with refills to hold her until her follow up appointment.  However, if she does not keep that appointment, then I can no longer refill or prescribe any medications.

## 2016-12-27 NOTE — Telephone Encounter (Signed)
Left message letting patient know that we will appeal the EEG because it should have been covered.  Requested for her to call our office to schedule a follow up appointment before I refill her medication.

## 2016-12-28 ENCOUNTER — Other Ambulatory Visit: Payer: Self-pay | Admitting: *Deleted

## 2016-12-28 MED ORDER — NORTRIPTYLINE HCL 25 MG PO CAPS: 25.0000 mg | ORAL_CAPSULE | Freq: Every day | ORAL | 0 refills | Status: DC

## 2016-12-28 NOTE — Telephone Encounter (Signed)
Left another message informing patient about what we are going to do and requested for her to call us back to schedule an appointment for a follow up.

## 2017-01-02 LAB — CUP PACEART REMOTE DEVICE CHECK
MDC IDC PG IMPLANT DT: 20151223
MDC IDC SESS DTM: 20180609100942

## 2017-01-05 ENCOUNTER — Telehealth: Payer: Self-pay | Admitting: Internal Medicine

## 2017-01-05 ENCOUNTER — Telehealth: Payer: Self-pay | Admitting: Cardiology

## 2017-01-05 NOTE — Telephone Encounter (Signed)
Patient states that has loop recorder but has no insurance. States that she need us to have them stop doing recordings. / tg

## 2017-01-05 NOTE — Telephone Encounter (Signed)
I advised patient to speak with device clinic as they are the ones collecting the transmissions, she will reach out to them

## 2017-01-05 NOTE — Telephone Encounter (Signed)
Pt called and stated that she recently lost her job and her insurance. She is requesting that she not be billed anymore until she gets insurance. She will call and let the office know when she has insurance coverage again. Informed pt that I would make MD aware. Pt verbalized understanding.

## 2017-01-07 IMAGING — CT CT ANGIO HEAD
1 of 9 series · 11 of 47 positions shown · IV contrast ([ID] ISOVUE 370)
Comparison: None.

CLINICAL DATA: Syncope and collapse. Hemi paresis and speech
disturbance.

EXAM:
CT ANGIOGRAPHY HEAD
TECHNIQUE: Multidetector CT imaging of the head was performed using the
standard protocol during bolus administration of intravenous
contrast. Multiplanar CT image reconstructions and MIPs were
obtained to evaluate the vascular anatomy.
CONTRAST:  100 mL Isovue 370 IV

[Series 604: axial thin · axial · 0.49mm/px · z∈[+106,+240]mm · 11 of 162 slices shown]
[im 14/162  brain]
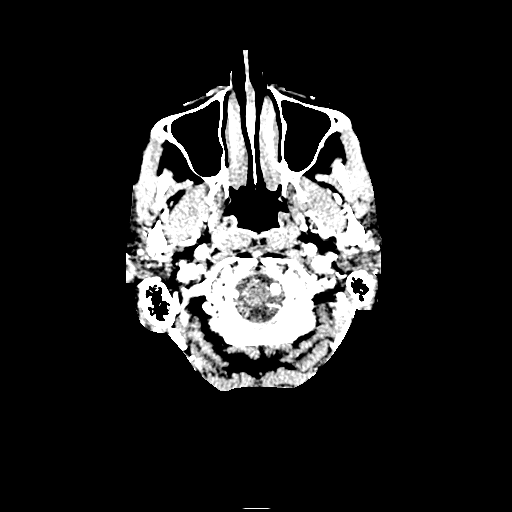
[im 27/162  bone]
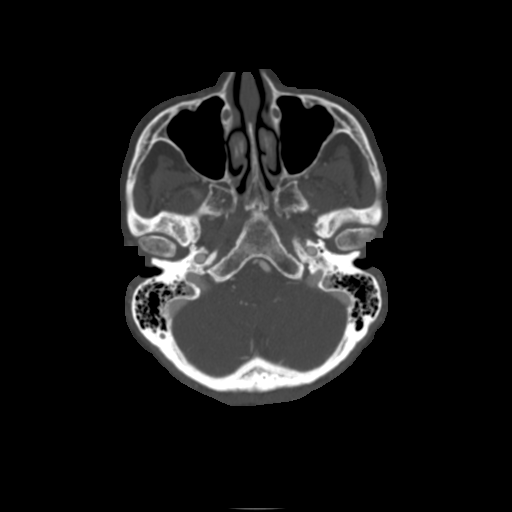
[im 41/162  brain]
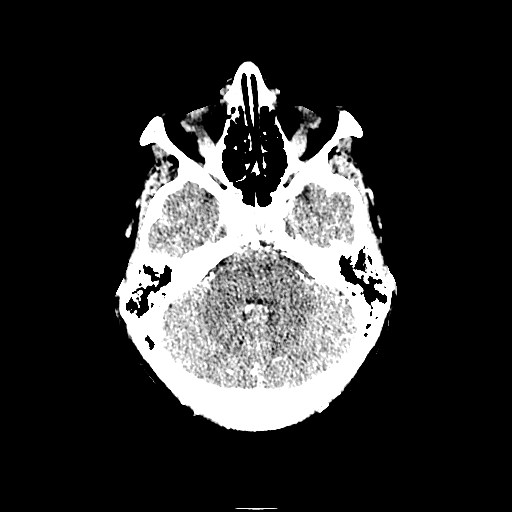
[im 54/162  bone]
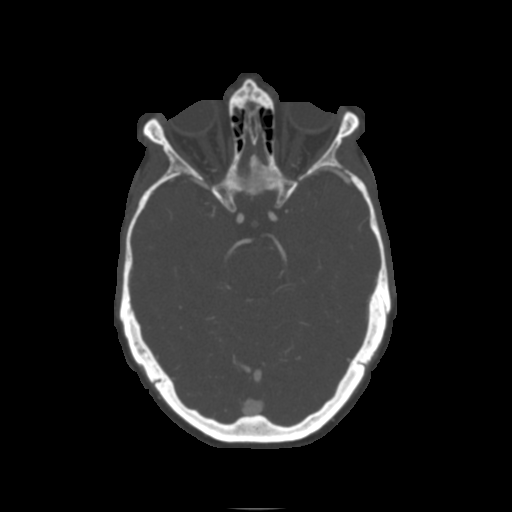
[im 68/162  brain]
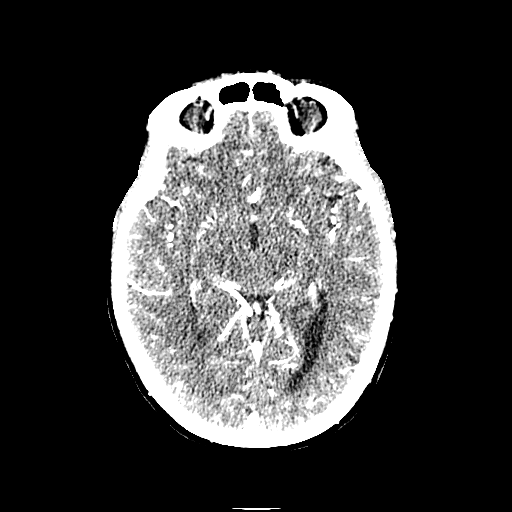
[im 81/162  bone]
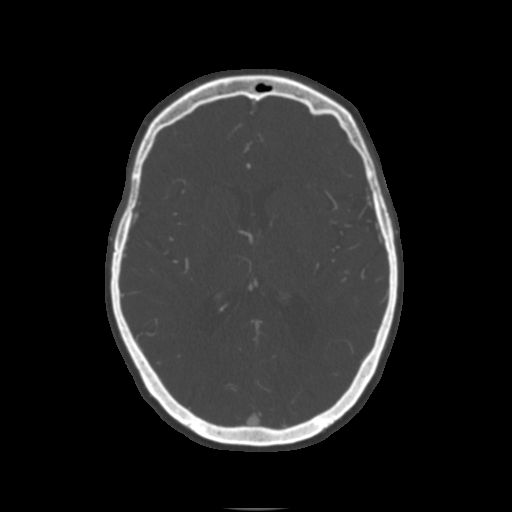
[im 94/162  brain]
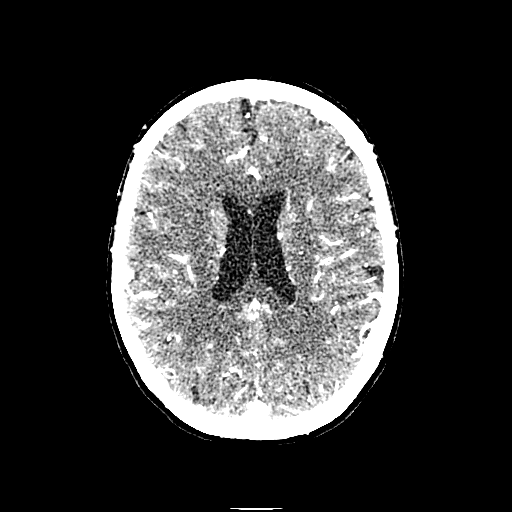
[im 108/162  bone]
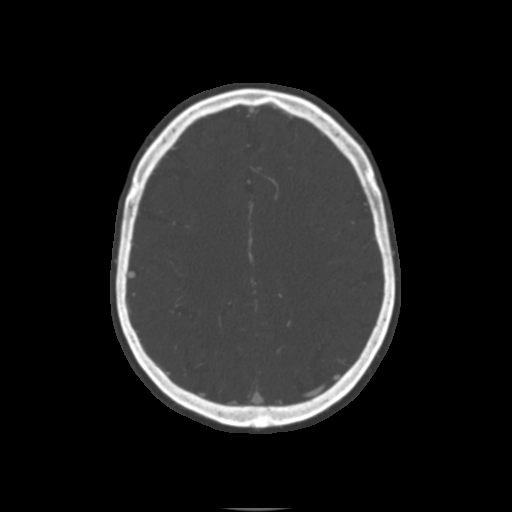
[im 121/162  brain]
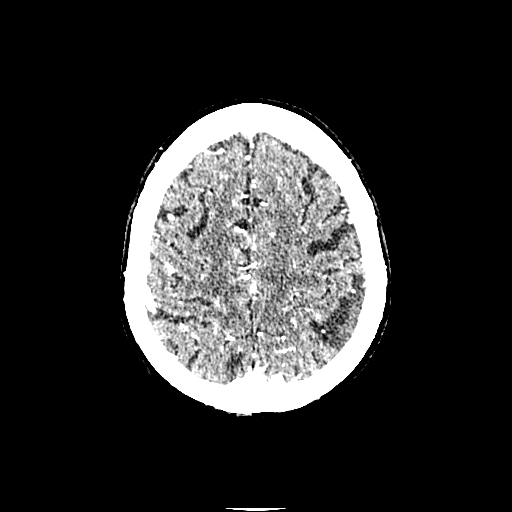
[im 135/162  bone]
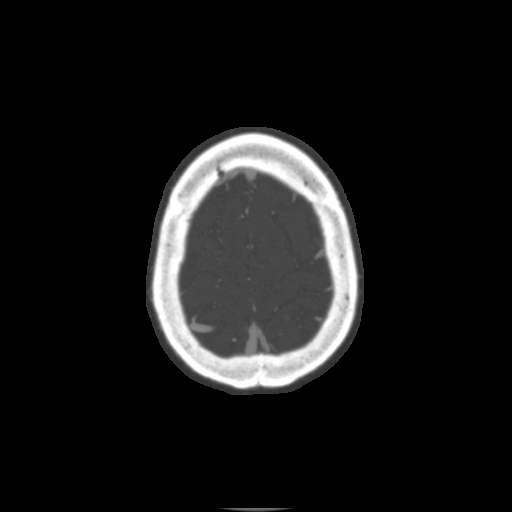
[im 148/162  brain]
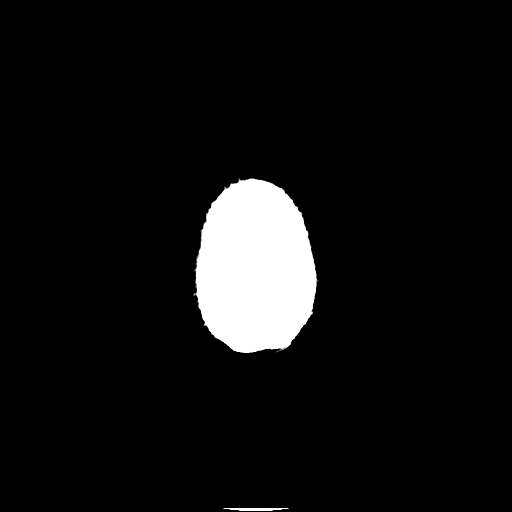

[11 of 47 positions shown; findings below may reference images not displayed]

FINDINGS: CT HEAD

Brain: Ventricle size normal. Cerebral volume normal for age.
Negative for acute or chronic infarction. Negative for hemorrhage or
mass.

Calvarium and skull base: Negative

Paranasal sinuses: Negative

Orbits: Negative

CTA HEAD

Anterior circulation: Mild atherosclerotic calcification in the
cavernous carotid bilaterally without stenosis. No carotid aneurysm.
Anterior and middle cerebral arteries patent bilaterally without
stenosis

Posterior circulation: Both vertebral arteries patent to the
basilar. Left vertebral dominant. PICA patent bilaterally. Basilar
widely patent. AICA, superior cerebellar, posterior cerebral
arteries patent without stenosis

Venous sinuses: Patent

Anatomic variants: Negative for aneurysm.  No vascular malformation.

Delayed phase:Normal enhancement on delayed imaging. No enhancing
mass lesion.
IMPRESSION: Mild atherosclerotic calcification in the cavernous carotid
bilaterally. No significant intracranial stenosis or aneurysm.

No acute intracranial abnormality.

## 2017-01-22 ENCOUNTER — Ambulatory Visit (INDEPENDENT_AMBULATORY_CARE_PROVIDER_SITE_OTHER): Payer: No Typology Code available for payment source | Admitting: *Deleted

## 2017-01-22 DIAGNOSIS — R002 Palpitations: Secondary | ICD-10-CM

## 2017-01-23 NOTE — Progress Notes (Signed)
Carelink Summary Report / Loop Recorder 

## 2017-01-31 LAB — CUP PACEART REMOTE DEVICE CHECK
Date Time Interrogation Session: 20180709121614
Implantable Pulse Generator Implant Date: 20151223

## 2017-02-16 ENCOUNTER — Ambulatory Visit (INDEPENDENT_AMBULATORY_CARE_PROVIDER_SITE_OTHER): Payer: No Typology Code available for payment source | Admitting: Neurology

## 2017-02-16 ENCOUNTER — Encounter: Payer: Self-pay | Admitting: Neurology

## 2017-02-16 VITALS — BP 122/54 | HR 72 | Ht 64.0 in | Wt 125.9 lb

## 2017-02-16 DIAGNOSIS — F17200 Nicotine dependence, unspecified, uncomplicated: Secondary | ICD-10-CM | POA: Diagnosis not present

## 2017-02-16 DIAGNOSIS — G43109 Migraine with aura, not intractable, without status migrainosus: Secondary | ICD-10-CM | POA: Diagnosis not present

## 2017-02-16 MED ORDER — NORTRIPTYLINE HCL 50 MG PO CAPS: 50.0000 mg | ORAL_CAPSULE | Freq: Every day | ORAL | 5 refills | Status: AC

## 2017-02-16 NOTE — Progress Notes (Signed)
NEUROLOGY FOLLOW UP OFFICE NOTE  Maria Webster [id number]  HISTORY OF PRESENT ILLNESS: Maria Webster is a 61 year old right-handed female with lupus, RA, palpitations and tobacco use who follows up for recurrent episodes of right sided numbness and weakness and syncope.  She is accompanied by her husband who supplements history.   UPDATE: For possible basilar migraine, she was started on nortriptyline 25mg .  Since last visit, she hasn't had any episodes.  She doe have some dizziness/vertigo sometimes.  She is feeling much better.  She denies episodes of headache, vision loss, numbness, weakness and loss of consciousness.   HISTORY: She has experienced stereotypical spells since 2003-2005.  They initially occurred once a month but has increased in frequency over the years to now several times a week. Semiology is described as sudden onset with gradual progression of tingling and numbness of right side of face, with drooling and drooping of the right side of her mouth, with paresis of the right arm.  She also experiences "fullness in the head" with lightheadedness, but no headache.  Symptoms last for just a couple of minutes, but she feels "hungover" afterwards.  She feels "hungover" afterwards for a couple of days.  She also has experienced passing out with these spells.  Sometimes she passes out separate from these spells.  In 2017, she had an episode that was accompanied by vision loss for 5 minutes.  Afterwards, she saw an eye doctor who told her she has permanent vision loss compared to last year, but she is unsure of the diagnosis.  She was told to follow up in a year.   She has been followed by cardiology and vascular neurology at [facility].  Carotid dopplers were unremarkable.  Implantable loop recorder has revealed no VT/VF.  Prior MRI of brain, CTA of head and carotid dopplers have reportedly been unremarkable.  Carotid doppler from 11/05/15 revealed no hemodynamically significant stenosis.  CTA of  head from 11/08/15 revealed no significant intracranial stenosis.  For palpitations, she has been wearing an implantable loop recorder, which has been unremarkable.  She had a 72 hour ambulatory EEG in August 2017, which captured 2 habitual events of speech disturbance as well as two episodes of dizziness with facial tingling, both without electrographic correlate.   From a neurologic standpoint, complex migraine was most suspected although a cardiac dysrhythmia could not completely be ruled out.   She is followed by cardiology for syncope.  TTE from 01/01/15 was unrevealing with EF 55-60%  Past migraine preventative:  verapamil (side effects)   She denies prior history of migraines or seizures.  PAST MEDICAL HISTORY: Past Medical History:  Diagnosis Date  . Dyslipidemia   . Hypertension   . Lupus   . Migraine with aura   . Palpitations   . Rheumatoid arteritis   . Syncope     MEDICATIONS: Current Outpatient Prescriptions on File Prior to Visit  Medication Sig Dispense Refill  . alendronate (FOSAMAX) 70 MG tablet Take 70 mg by mouth once a week.  0  . amLODipine (NORVASC) 10 MG tablet Take 1 tablet (10 mg total) by mouth daily. 90 tablet 3  . aspirin EC 81 MG tablet Take 81 mg by mouth daily.    Marland Kitchen atorvastatin (LIPITOR) 40 MG tablet Take 40 mg by mouth every evening.     . Cholecalciferol (VITAMIN D3) 2000 UNITS capsule Take 2,000 Units by mouth daily.    Marland Kitchen gabapentin (NEURONTIN) 300 MG capsule     .  hydroxychloroquine (PLAQUENIL) 200 MG tablet Take 200 mg by mouth daily.     Marland Kitchen. losartan (COZAAR) 100 MG tablet Take 1 tablet (100 mg total) by mouth daily. 90 tablet 3  . metoprolol tartrate (LOPRESSOR) 25 MG tablet Take 1 tablet (25 mg total) by mouth 2 (two) times daily. 180 tablet 3  . vitamin C (ASCORBIC ACID) 500 MG tablet Take 500 mg by mouth daily.     No current facility-administered medications on file prior to visit.     ALLERGIES: Allergies  Allergen Reactions  .  Codeine Swelling    Swelling   . Latex Hives  . Novocain [Procaine] Other (See Comments)    Other reaction(s): Other (See Comments) Jerks and shakes real bad Jerks and shakes real bad "Jerks and shakes real bad"    FAMILY HISTORY: Family History  Problem Relation Age of Onset  . Hypertension Mother   . Heart disease Mother   . Migraines Mother   . Heart disease Sister   . Migraines Sister   . Heart attack Sister   . Heart disease Brother   . Leukemia Brother   . Heart attack Sister   . Heart disease Brother     SOCIAL HISTORY: Social History   Social History  . Marital status: Married    Spouse name: N/A  . Number of children: N/A  . Years of education: N/A   Occupational History  . Not on file.   Social History Main Topics  . Smoking status: Current Every Day Smoker    Packs/day: 0.50    Types: Cigarettes    Start date: 07/22/1962  . Smokeless tobacco: Never Used  . Alcohol use No  . Drug use: No  . Sexual activity: Not on file   Other Topics Concern  . Not on file   Social History Narrative  . No narrative on file    REVIEW OF SYSTEMS: Constitutional: No fevers, chills, or sweats, no generalized fatigue, change in appetite Eyes: No visual changes, double vision, eye pain Ear, nose and throat: No hearing loss, ear pain, nasal congestion, sore throat Cardiovascular: No chest pain, palpitations Respiratory:  No shortness of breath at rest or with exertion, wheezes GastrointestinaI: No nausea, vomiting, diarrhea, abdominal pain, fecal incontinence Genitourinary:  No dysuria, urinary retention or frequency Musculoskeletal:  No neck pain, back pain Integumentary: No rash, pruritus, skin lesions Neurological: as above Psychiatric: No depression, insomnia, anxiety Endocrine: No palpitations, fatigue, diaphoresis, mood swings, change in appetite, change in weight, increased thirst Hematologic/Lymphatic:  No purpura, petechiae. Allergic/Immunologic: no  itchy/runny eyes, nasal congestion, recent allergic reactions, rashes  PHYSICAL EXAM: Vitals:   02/16/17 1349  BP: (!) 122/54  Pulse: 72   General: No acute distress.  Patient appears well-groomed.  normal body habitus. Head:  Normocephalic/atraumatic Eyes:  Fundi examined but not visualized Neck: supple, no paraspinal tenderness, full range of motion Heart:  Regular rate and rhythm Lungs:  Clear to auscultation bilaterally Back: No paraspinal tenderness Neurological Exam: alert and oriented to person, place, and time. Attention span and concentration intact, recent and remote memory intact, fund of knowledge intact.  Speech fluent and not dysarthric, language intact.  CN II-XII intact. Bulk and tone normal, muscle strength 5/5 throughout.  Sensation to light touch, temperature and vibration intact.  Deep tendon reflexes 2+ throughout, toes downgoing.  Finger to nose and heel to shin testing intact.  Gait normal, Romberg negative.  IMPRESSION: Basilar migraine Tobacco use disorder  PLAN: 1.  We  will increase nortriptyline to 50mg  at bedtime to see if we can control the vertigo better. 2.  Smoking cessation 3.  Follow up in 4 months.  25 minutes spent face to face with patient, over 50% spent discussing management and diagnosis.  Shon MilletAdam Arijana Narayan, DO  CC:  Donia PoundsSharukh Shroff, MD  Dietrich PatesPaula Ross, MD

## 2017-02-16 NOTE — Patient Instructions (Signed)
I increased nortriptyline to 50mg  at bedtime Follow up in 4 months.

## 2017-02-21 ENCOUNTER — Ambulatory Visit (INDEPENDENT_AMBULATORY_CARE_PROVIDER_SITE_OTHER): Payer: No Typology Code available for payment source | Admitting: *Deleted

## 2017-02-21 DIAGNOSIS — R002 Palpitations: Secondary | ICD-10-CM | POA: Diagnosis not present

## 2017-02-22 NOTE — Progress Notes (Signed)
Carelink Summary Report / Loop Recorder 

## 2017-03-01 LAB — CUP PACEART REMOTE DEVICE CHECK
Date Time Interrogation Session: 20180808124032
Implantable Pulse Generator Implant Date: 20151223

## 2017-03-23 ENCOUNTER — Ambulatory Visit (INDEPENDENT_AMBULATORY_CARE_PROVIDER_SITE_OTHER): Payer: No Typology Code available for payment source | Admitting: *Deleted

## 2017-03-23 DIAGNOSIS — R002 Palpitations: Secondary | ICD-10-CM

## 2017-03-23 NOTE — Progress Notes (Signed)
Carelink Summary Report / Loop Recorder 

## 2017-03-28 LAB — CUP PACEART REMOTE DEVICE CHECK
Date Time Interrogation Session: 20180907134121
MDC IDC PG IMPLANT DT: 20151223

## 2017-04-18 ENCOUNTER — Telehealth: Payer: Self-pay | Admitting: Cardiology

## 2017-04-18 NOTE — Telephone Encounter (Signed)
LMOVM requesting that pt send manual transmission b/c home monitor has not updated in at least 14 days.    

## 2017-04-23 ENCOUNTER — Ambulatory Visit: Payer: No Typology Code available for payment source | Admitting: *Deleted

## 2017-04-23 DIAGNOSIS — R002 Palpitations: Secondary | ICD-10-CM

## 2017-04-24 NOTE — Progress Notes (Signed)
Carelink Summary Report / Loop Recorder 

## 2017-04-26 ENCOUNTER — Encounter: Payer: Self-pay | Admitting: Cardiology

## 2017-05-09 ENCOUNTER — Telehealth: Payer: Self-pay | Admitting: Cardiology

## 2017-05-09 NOTE — Telephone Encounter (Signed)
Patient called and stated that she received a bill for August and September 2018. She believes that her home monitor was unplugged early August. I informed pt that reports have been completed and I could transfer the call to medical recorders and she can discuss picking up the two reports. Pt stated that she would arrange a payment plan and pay the bill.

## 2017-05-10 ENCOUNTER — Encounter: Payer: Self-pay | Admitting: Cardiology

## 2017-05-22 ENCOUNTER — Encounter: Payer: No Typology Code available for payment source | Admitting: *Deleted

## 2017-06-22 ENCOUNTER — Ambulatory Visit: Payer: No Typology Code available for payment source | Admitting: Neurology
